# Patient Record
Sex: Female | Born: 1950
Health system: Southern US, Community
[De-identification: ages and names within clinical notes are randomized; demographics above are authoritative.]

## PROBLEM LIST (undated history)

## (undated) DIAGNOSIS — K589 Irritable bowel syndrome without diarrhea: Secondary | ICD-10-CM

## (undated) DIAGNOSIS — M1990 Unspecified osteoarthritis, unspecified site: Secondary | ICD-10-CM

## (undated) DIAGNOSIS — B029 Zoster without complications: Secondary | ICD-10-CM

## (undated) DIAGNOSIS — T7840XA Allergy, unspecified, initial encounter: Secondary | ICD-10-CM

## (undated) DIAGNOSIS — M810 Age-related osteoporosis without current pathological fracture: Secondary | ICD-10-CM

## (undated) DIAGNOSIS — E559 Vitamin D deficiency, unspecified: Secondary | ICD-10-CM

## (undated) DIAGNOSIS — R42 Dizziness and giddiness: Secondary | ICD-10-CM

## (undated) DIAGNOSIS — K5790 Diverticulosis of intestine, part unspecified, without perforation or abscess without bleeding: Secondary | ICD-10-CM

## (undated) DIAGNOSIS — E785 Hyperlipidemia, unspecified: Secondary | ICD-10-CM

## (undated) HISTORY — PX: FOOT SURGERY: SHX648

## (undated) HISTORY — DX: Hyperlipidemia, unspecified: E78.5

## (undated) HISTORY — DX: Zoster without complications: B02.9

## (undated) HISTORY — PX: COSMETIC SURGERY: SHX468

## (undated) HISTORY — DX: Age-related osteoporosis without current pathological fracture: M81.0

## (undated) HISTORY — DX: Allergy, unspecified, initial encounter: T78.40XA

## (undated) HISTORY — PX: COLONOSCOPY: SHX174

## (undated) HISTORY — DX: Unspecified osteoarthritis, unspecified site: M19.90

## (undated) HISTORY — PX: RHINOPLASTY: SUR1284

## (undated) HISTORY — DX: Dizziness and giddiness: R42

## (undated) HISTORY — DX: Diverticulosis of intestine, part unspecified, without perforation or abscess without bleeding: K57.90

## (undated) HISTORY — PX: VEIN SURGERY: SHX48

## (undated) HISTORY — DX: Vitamin D deficiency, unspecified: E55.9

## (undated) HISTORY — DX: Irritable bowel syndrome, unspecified: K58.9

---

## 1999-01-01 ENCOUNTER — Other Ambulatory Visit: Admission: RE | Admit: 1999-01-01 | Discharge: 1999-01-01 | Payer: Self-pay | Admitting: Obstetrics and Gynecology

## 1999-10-03 ENCOUNTER — Other Ambulatory Visit: Admission: RE | Admit: 1999-10-03 | Discharge: 1999-10-03 | Payer: Self-pay | Admitting: Otolaryngology

## 2000-04-03 ENCOUNTER — Other Ambulatory Visit: Admission: RE | Admit: 2000-04-03 | Discharge: 2000-04-03 | Payer: Self-pay | Admitting: Obstetrics and Gynecology

## 2001-04-27 ENCOUNTER — Other Ambulatory Visit: Admission: RE | Admit: 2001-04-27 | Discharge: 2001-04-27 | Payer: Self-pay | Admitting: Obstetrics and Gynecology

## 2002-04-27 ENCOUNTER — Other Ambulatory Visit: Admission: RE | Admit: 2002-04-27 | Discharge: 2002-04-27 | Payer: Self-pay | Admitting: Obstetrics and Gynecology

## 2003-05-08 ENCOUNTER — Other Ambulatory Visit: Admission: RE | Admit: 2003-05-08 | Discharge: 2003-05-08 | Payer: Self-pay | Admitting: Obstetrics and Gynecology

## 2004-05-09 ENCOUNTER — Other Ambulatory Visit: Admission: RE | Admit: 2004-05-09 | Discharge: 2004-05-09 | Payer: Self-pay | Admitting: Obstetrics and Gynecology

## 2005-06-17 ENCOUNTER — Other Ambulatory Visit: Admission: RE | Admit: 2005-06-17 | Discharge: 2005-06-17 | Payer: Self-pay | Admitting: Obstetrics and Gynecology

## 2005-07-08 ENCOUNTER — Emergency Department (HOSPITAL_COMMUNITY): Admission: EM | Admit: 2005-07-08 | Discharge: 2005-07-08 | Payer: Self-pay | Admitting: Emergency Medicine

## 2006-06-30 ENCOUNTER — Ambulatory Visit (HOSPITAL_COMMUNITY): Admission: RE | Admit: 2006-06-30 | Discharge: 2006-06-30 | Payer: Self-pay | Admitting: Obstetrics and Gynecology

## 2011-01-16 ENCOUNTER — Ambulatory Visit (HOSPITAL_COMMUNITY)
Admission: RE | Admit: 2011-01-16 | Discharge: 2011-01-16 | Disposition: A | Discharge status: Home / Self Care | Payer: BC Managed Care – PPO | Source: Ambulatory Visit | Attending: Gastroenterology | Admitting: Gastroenterology

## 2011-01-16 DIAGNOSIS — Z79899 Other long term (current) drug therapy: Secondary | ICD-10-CM | POA: Insufficient documentation

## 2011-01-16 DIAGNOSIS — K5730 Diverticulosis of large intestine without perforation or abscess without bleeding: Secondary | ICD-10-CM | POA: Insufficient documentation

## 2011-01-16 DIAGNOSIS — E785 Hyperlipidemia, unspecified: Secondary | ICD-10-CM | POA: Insufficient documentation

## 2011-01-16 DIAGNOSIS — Z1211 Encounter for screening for malignant neoplasm of colon: Secondary | ICD-10-CM | POA: Insufficient documentation

## 2011-01-16 DIAGNOSIS — R42 Dizziness and giddiness: Secondary | ICD-10-CM | POA: Insufficient documentation

## 2011-01-23 NOTE — Op Note (Signed)
  NAME:  MEELA, WAREING NO.:  [id number]  MEDICAL RECORD NO.:  [id number]  LOCATION:  [facility]                         FACILITY:  [facility]  PHYSICIAN:  Danise Edge, M.D.   DATE OF BIRTH:  May 06, 1951  DATE OF PROCEDURE:  01/16/2011 DATE OF DISCHARGE:                              OPERATIVE REPORT   REFERRING PHYSICIANS:  Pearla Dubonnet, M.D., Maris Berger. Pennie Rushing, M.D.  PROCEDURE:  Screening colonoscopy.  HISTORY:  Ms. Lydia Toren is a 60 year old female, born on [birth year].  The patient underwent a normal screening colonoscopy in 2002. She is scheduled to undergo a repeat screening colonoscopy today.  ENDOSCOPIST:  Danise Edge, M.D.  PREMEDICATION:  Fentanyl 100 mcg, Versed 10 mg.  PROCEDURE:  Anal inspection and digital rectal exam were normal.  The Pentax pediatric colonoscope was introduced into the rectum and easily advanced to the cecum.  A normal-appearing ileocecal valve and appendiceal orifice were identified.  Colonic preparation for the exam today was good.  Rectum normal.  Retroflexed view of the distal rectum normal.  Sigmoid colon and descending colon.  Sigmoid colonic diverticulosis without diverticulitis.  Splenic flexure normal.  Transverse colon normal.  Hepatic flexure normal.  Ascending colon normal.  Cecum and ileocecal valve normal.  ASSESSMENT:  Normal screening proctocolonoscopy to the cecum except for the presence of sigmoid colonic diverticulosis.  RECOMMENDATIONS:  Repeat screening colonoscopy in 10 years.          ______________________________ Danise Edge, M.D.     MJ/MEDQ  D:  01/16/2011  T:  01/17/2011  Job:  [id number]  cc:   Pearla Dubonnet, M.D. Fax: [phone]  Hal Morales, M.D. Fax: [phone]  Electronically Signed by Danise Edge M.D. on 01/23/2011 04:47:56 PM

## 2011-08-27 ENCOUNTER — Ambulatory Visit: Payer: Self-pay | Admitting: Obstetrics and Gynecology

## 2011-09-09 ENCOUNTER — Ambulatory Visit: Payer: Self-pay | Admitting: Obstetrics and Gynecology

## 2011-09-10 ENCOUNTER — Other Ambulatory Visit: Payer: Self-pay

## 2011-09-15 ENCOUNTER — Other Ambulatory Visit (INDEPENDENT_AMBULATORY_CARE_PROVIDER_SITE_OTHER): Payer: BC Managed Care – PPO

## 2011-09-15 ENCOUNTER — Ambulatory Visit: Payer: Self-pay | Admitting: Obstetrics and Gynecology

## 2011-09-15 ENCOUNTER — Encounter: Payer: Self-pay | Admitting: Obstetrics and Gynecology

## 2011-09-15 ENCOUNTER — Ambulatory Visit (INDEPENDENT_AMBULATORY_CARE_PROVIDER_SITE_OTHER): Payer: BC Managed Care – PPO | Admitting: Obstetrics and Gynecology

## 2011-09-15 ENCOUNTER — Other Ambulatory Visit: Payer: Self-pay | Admitting: Obstetrics and Gynecology

## 2011-09-15 VITALS — BP 120/76 | Ht 62.75 in | Wt 155.0 lb

## 2011-09-15 DIAGNOSIS — M899 Disorder of bone, unspecified: Secondary | ICD-10-CM

## 2011-09-15 DIAGNOSIS — Z13820 Encounter for screening for osteoporosis: Secondary | ICD-10-CM

## 2011-09-15 DIAGNOSIS — N951 Menopausal and female climacteric states: Secondary | ICD-10-CM

## 2011-09-15 DIAGNOSIS — M8580 Other specified disorders of bone density and structure, unspecified site: Secondary | ICD-10-CM | POA: Insufficient documentation

## 2011-09-15 DIAGNOSIS — Z01419 Encounter for gynecological examination (general) (routine) without abnormal findings: Secondary | ICD-10-CM

## 2011-09-15 MED ORDER — ZOLPIDEM TARTRATE 10 MG PO TABS: 10.0000 mg | ORAL_TABLET | Freq: Every evening | ORAL | Status: DC | PRN

## 2011-09-15 MED ORDER — ESTRADIOL 2 MG VA RING: 2.0000 mg | VAGINAL_RING | VAGINAL | Status: DC

## 2011-09-15 NOTE — Patient Instructions (Signed)
Calcium Intake Recommendations Age group / Amount of calcium to consume daily, in milligrams (mg)  0 to 6 months / 210 mg   7 to 12 months / 270 mg   1 to 3 years / 500 mg   4 to 8 years / 800 mg   9 to 18 years / 1,300 mg   19 to 50 years / 1,000 mg   51 to 70+ years / 1,200 mg   Pregnant and nursing, under 19 years / 1,300 mg   Pregnant and nursing, over 19 years / 1,000 mg  Document Released: 01/01/2004 Document Revised: 05/08/2011 Document Reviewed: 05/19/2005 ExitCare Patient Information 2012 [employer]. 

## 2011-09-15 NOTE — Progress Notes (Signed)
Subjective:    Sharon Fox is a 61 y.o. female  who presents for annual exam.  The patient has no complaints today.   The following portions of the patient's history were reviewed and updated as appropriate: allergies, current medications, past family history, past medical history, past social history, past surgical history and problem list.  Review of Systems Pertinent items are noted in HPI. Gastrointestinal:No change in bowel habits, no abdominal pain, no rectal bleeding Genitourinary:negative for dysuria, frequency, hematuria, nocturia and urinary incontinence    Objective:     BP 120/76  Ht 5' 2.75" (1.594 m)  Wt 155 lb (70.308 kg)  BMI 27.68 kg/m2  Weight:  Wt Readings from Last 1 Encounters:  09/15/11 155 lb (70.308 kg)     BMI: Body mass index is 27.68 kg/(m^2). General Appearance: Alert, appropriate appearance for age. No acute distress HEENT: Grossly normal Neck / Thyroid: Supple, no masses, nodes or enlargement Lungs: clear to auscultation bilaterally Back: No CVA tenderness Breast Exam: No masses or nodes.No dimpling, nipple retraction or discharge. Cardiovascular: Regular rate and rhythm. S1, S2, no murmur Gastrointestinal: Soft, non-tender, no masses or organomegaly Pelvic Exam: Vulva and vagina appear normal. Bimanual exam reveals normal uterus and adnexa. Rectovaginal: normal rectal, no masses Lymphatic Exam: Non-palpable nodes in neck, clavicular, axillary, or inguinal regions Skin: no rash or abnormalities Neurologic: Normal gait and speech, no tremor  Psychiatric: Alert and oriented, appropriate affect.    Urinalysis:na and Not done DXA:  Osteopenia at all sites with FRAX of 11% for all and 1.6% for hip fracture      Assessment:    Normal gyn exam Hormone replacement therapy Menopause osteopenia    Plan:    All questions answered. Discussed healthy lifestyle modifications. Continue current meds  Vit D level today Increase  exercise Repeat DXA in 2 years.  Follow-up:  for annual exam

## 2011-09-18 LAB — VITAMIN D 1,25 DIHYDROXY
Vitamin D 1, 25 (OH)2 Total: 69 pg/mL (ref 18–72)
Vitamin D2 1, 25 (OH)2: 21 pg/mL
Vitamin D3 1, 25 (OH)2: 48 pg/mL

## 2011-09-29 ENCOUNTER — Ambulatory Visit: Payer: Self-pay | Admitting: Obstetrics and Gynecology

## 2014-04-03 ENCOUNTER — Encounter: Payer: Self-pay | Admitting: Obstetrics and Gynecology

## 2015-07-24 DIAGNOSIS — L723 Sebaceous cyst: Secondary | ICD-10-CM | POA: Diagnosis not present

## 2015-07-24 DIAGNOSIS — Z411 Encounter for cosmetic surgery: Secondary | ICD-10-CM | POA: Diagnosis not present

## 2015-07-24 DIAGNOSIS — L821 Other seborrheic keratosis: Secondary | ICD-10-CM | POA: Diagnosis not present

## 2015-07-24 DIAGNOSIS — L738 Other specified follicular disorders: Secondary | ICD-10-CM | POA: Diagnosis not present

## 2015-07-24 DIAGNOSIS — Z23 Encounter for immunization: Secondary | ICD-10-CM | POA: Diagnosis not present

## 2015-10-12 DIAGNOSIS — Z803 Family history of malignant neoplasm of breast: Secondary | ICD-10-CM | POA: Diagnosis not present

## 2015-10-12 DIAGNOSIS — Z1231 Encounter for screening mammogram for malignant neoplasm of breast: Secondary | ICD-10-CM | POA: Diagnosis not present

## 2015-11-02 ENCOUNTER — Ambulatory Visit (HOSPITAL_COMMUNITY)
Admission: EM | Admit: 2015-11-02 | Discharge: 2015-11-02 | Disposition: A | Discharge status: Home / Self Care | Payer: Medicare Other | Attending: Emergency Medicine | Admitting: Emergency Medicine

## 2015-11-02 ENCOUNTER — Encounter (HOSPITAL_COMMUNITY): Payer: Self-pay

## 2015-11-02 DIAGNOSIS — L720 Epidermal cyst: Secondary | ICD-10-CM | POA: Diagnosis not present

## 2015-11-02 DIAGNOSIS — D225 Melanocytic nevi of trunk: Secondary | ICD-10-CM | POA: Diagnosis not present

## 2015-11-02 DIAGNOSIS — D239 Other benign neoplasm of skin, unspecified: Secondary | ICD-10-CM | POA: Diagnosis not present

## 2015-11-02 DIAGNOSIS — R0789 Other chest pain: Secondary | ICD-10-CM

## 2015-11-02 DIAGNOSIS — L821 Other seborrheic keratosis: Secondary | ICD-10-CM | POA: Diagnosis not present

## 2015-11-02 MED ORDER — NAPROXEN 500 MG PO TABS: 500.0000 mg | ORAL_TABLET | Freq: Two times a day (BID) | ORAL | Status: DC | PRN

## 2015-11-02 MED ORDER — TRAMADOL HCL 50 MG PO TABS: 50.0000 mg | ORAL_TABLET | Freq: Four times a day (QID) | ORAL | Status: DC | PRN
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
## 2015-11-05 DIAGNOSIS — S20219A Contusion of unspecified front wall of thorax, initial encounter: Secondary | ICD-10-CM | POA: Diagnosis not present

## 2016-01-01 DIAGNOSIS — H11153 Pinguecula, bilateral: Secondary | ICD-10-CM | POA: Diagnosis not present

## 2016-01-01 DIAGNOSIS — H01029 Squamous blepharitis unspecified eye, unspecified eyelid: Secondary | ICD-10-CM | POA: Diagnosis not present

## 2016-01-01 DIAGNOSIS — H04123 Dry eye syndrome of bilateral lacrimal glands: Secondary | ICD-10-CM | POA: Diagnosis not present

## 2016-01-29 DIAGNOSIS — Z6826 Body mass index (BMI) 26.0-26.9, adult: Secondary | ICD-10-CM | POA: Diagnosis not present

## 2016-01-29 DIAGNOSIS — G4700 Insomnia, unspecified: Secondary | ICD-10-CM | POA: Diagnosis not present

## 2016-01-29 DIAGNOSIS — N952 Postmenopausal atrophic vaginitis: Secondary | ICD-10-CM | POA: Diagnosis not present

## 2016-01-29 DIAGNOSIS — D259 Leiomyoma of uterus, unspecified: Secondary | ICD-10-CM | POA: Diagnosis not present

## 2016-01-29 DIAGNOSIS — Z01419 Encounter for gynecological examination (general) (routine) without abnormal findings: Secondary | ICD-10-CM | POA: Diagnosis not present

## 2016-02-14 DIAGNOSIS — M8580 Other specified disorders of bone density and structure, unspecified site: Secondary | ICD-10-CM | POA: Diagnosis not present

## 2016-03-11 DIAGNOSIS — Z23 Encounter for immunization: Secondary | ICD-10-CM | POA: Diagnosis not present

## 2016-06-24 DIAGNOSIS — E559 Vitamin D deficiency, unspecified: Secondary | ICD-10-CM | POA: Diagnosis not present

## 2016-06-24 DIAGNOSIS — Z79899 Other long term (current) drug therapy: Secondary | ICD-10-CM | POA: Diagnosis not present

## 2016-06-24 DIAGNOSIS — K5790 Diverticulosis of intestine, part unspecified, without perforation or abscess without bleeding: Secondary | ICD-10-CM | POA: Diagnosis not present

## 2016-06-24 DIAGNOSIS — K589 Irritable bowel syndrome without diarrhea: Secondary | ICD-10-CM | POA: Diagnosis not present

## 2016-06-24 DIAGNOSIS — J301 Allergic rhinitis due to pollen: Secondary | ICD-10-CM | POA: Diagnosis not present

## 2016-06-24 DIAGNOSIS — Z23 Encounter for immunization: Secondary | ICD-10-CM | POA: Diagnosis not present

## 2016-06-24 DIAGNOSIS — Z0000 Encounter for general adult medical examination without abnormal findings: Secondary | ICD-10-CM | POA: Diagnosis not present

## 2016-06-24 DIAGNOSIS — E782 Mixed hyperlipidemia: Secondary | ICD-10-CM | POA: Diagnosis not present

## 2016-06-24 DIAGNOSIS — G4700 Insomnia, unspecified: Secondary | ICD-10-CM | POA: Diagnosis not present

## 2016-08-15 DIAGNOSIS — S92421A Displaced fracture of distal phalanx of right great toe, initial encounter for closed fracture: Secondary | ICD-10-CM | POA: Diagnosis not present

## 2016-09-26 ENCOUNTER — Other Ambulatory Visit: Payer: Self-pay | Admitting: Orthopedic Surgery

## 2016-09-26 DIAGNOSIS — S92421D Displaced fracture of distal phalanx of right great toe, subsequent encounter for fracture with routine healing: Secondary | ICD-10-CM | POA: Diagnosis not present

## 2016-09-26 DIAGNOSIS — S92421A Displaced fracture of distal phalanx of right great toe, initial encounter for closed fracture: Secondary | ICD-10-CM

## 2016-09-26 DIAGNOSIS — M79671 Pain in right foot: Secondary | ICD-10-CM | POA: Diagnosis not present

## 2016-09-26 DIAGNOSIS — G8929 Other chronic pain: Secondary | ICD-10-CM | POA: Diagnosis not present

## 2016-10-06 ENCOUNTER — Ambulatory Visit
Admission: RE | Admit: 2016-10-06 | Discharge: 2016-10-06 | Disposition: A | Discharge status: Home / Self Care | Payer: Medicare Other | Source: Ambulatory Visit | Attending: Orthopedic Surgery | Admitting: Orthopedic Surgery

## 2016-10-06 DIAGNOSIS — S92421K Displaced fracture of distal phalanx of right great toe, subsequent encounter for fracture with nonunion: Secondary | ICD-10-CM | POA: Diagnosis not present

## 2016-10-06 DIAGNOSIS — S92421A Displaced fracture of distal phalanx of right great toe, initial encounter for closed fracture: Secondary | ICD-10-CM

## 2016-10-15 DIAGNOSIS — S92411K Displaced fracture of proximal phalanx of right great toe, subsequent encounter for fracture with nonunion: Secondary | ICD-10-CM | POA: Diagnosis not present

## 2016-10-15 DIAGNOSIS — M19071 Primary osteoarthritis, right ankle and foot: Secondary | ICD-10-CM | POA: Diagnosis not present

## 2016-11-14 DIAGNOSIS — D225 Melanocytic nevi of trunk: Secondary | ICD-10-CM | POA: Diagnosis not present

## 2016-11-14 DIAGNOSIS — Z1231 Encounter for screening mammogram for malignant neoplasm of breast: Secondary | ICD-10-CM | POA: Diagnosis not present

## 2016-11-14 DIAGNOSIS — D235 Other benign neoplasm of skin of trunk: Secondary | ICD-10-CM | POA: Diagnosis not present

## 2016-11-14 DIAGNOSIS — D2230 Melanocytic nevi of unspecified part of face: Secondary | ICD-10-CM | POA: Diagnosis not present

## 2016-11-14 DIAGNOSIS — L720 Epidermal cyst: Secondary | ICD-10-CM | POA: Diagnosis not present

## 2016-11-14 DIAGNOSIS — L821 Other seborrheic keratosis: Secondary | ICD-10-CM | POA: Diagnosis not present

## 2016-12-24 DIAGNOSIS — M79672 Pain in left foot: Secondary | ICD-10-CM | POA: Diagnosis not present

## 2017-01-14 DIAGNOSIS — M545 Low back pain: Secondary | ICD-10-CM | POA: Diagnosis not present

## 2017-01-14 DIAGNOSIS — R197 Diarrhea, unspecified: Secondary | ICD-10-CM | POA: Diagnosis not present

## 2017-01-14 DIAGNOSIS — D259 Leiomyoma of uterus, unspecified: Secondary | ICD-10-CM | POA: Diagnosis not present

## 2017-01-14 DIAGNOSIS — R102 Pelvic and perineal pain: Secondary | ICD-10-CM | POA: Diagnosis not present

## 2017-01-21 DIAGNOSIS — M79672 Pain in left foot: Secondary | ICD-10-CM | POA: Diagnosis not present

## 2017-01-21 DIAGNOSIS — R2242 Localized swelling, mass and lump, left lower limb: Secondary | ICD-10-CM | POA: Diagnosis not present

## 2017-01-23 DIAGNOSIS — M79672 Pain in left foot: Secondary | ICD-10-CM | POA: Diagnosis not present

## 2017-01-27 DIAGNOSIS — D259 Leiomyoma of uterus, unspecified: Secondary | ICD-10-CM | POA: Diagnosis not present

## 2017-01-27 DIAGNOSIS — K580 Irritable bowel syndrome with diarrhea: Secondary | ICD-10-CM | POA: Diagnosis not present

## 2017-01-27 DIAGNOSIS — R102 Pelvic and perineal pain: Secondary | ICD-10-CM | POA: Diagnosis not present

## 2017-01-27 DIAGNOSIS — R1032 Left lower quadrant pain: Secondary | ICD-10-CM | POA: Diagnosis not present

## 2017-01-30 DIAGNOSIS — M79672 Pain in left foot: Secondary | ICD-10-CM | POA: Diagnosis not present

## 2017-01-30 DIAGNOSIS — R2242 Localized swelling, mass and lump, left lower limb: Secondary | ICD-10-CM | POA: Diagnosis not present

## 2017-01-30 DIAGNOSIS — D2122 Benign neoplasm of connective and other soft tissue of left lower limb, including hip: Secondary | ICD-10-CM | POA: Diagnosis not present

## 2017-02-03 DIAGNOSIS — Z23 Encounter for immunization: Secondary | ICD-10-CM | POA: Diagnosis not present

## 2017-02-03 DIAGNOSIS — L723 Sebaceous cyst: Secondary | ICD-10-CM | POA: Diagnosis not present

## 2017-02-03 DIAGNOSIS — L309 Dermatitis, unspecified: Secondary | ICD-10-CM | POA: Diagnosis not present

## 2017-02-16 DIAGNOSIS — K580 Irritable bowel syndrome with diarrhea: Secondary | ICD-10-CM | POA: Diagnosis not present

## 2017-02-19 DIAGNOSIS — L723 Sebaceous cyst: Secondary | ICD-10-CM | POA: Diagnosis not present

## 2017-02-19 DIAGNOSIS — Z23 Encounter for immunization: Secondary | ICD-10-CM | POA: Diagnosis not present

## 2017-03-21 DIAGNOSIS — Z23 Encounter for immunization: Secondary | ICD-10-CM | POA: Diagnosis not present

## 2017-05-08 DIAGNOSIS — T1512XA Foreign body in conjunctival sac, left eye, initial encounter: Secondary | ICD-10-CM | POA: Diagnosis not present

## 2017-05-08 DIAGNOSIS — H10413 Chronic giant papillary conjunctivitis, bilateral: Secondary | ICD-10-CM | POA: Diagnosis not present

## 2017-05-08 DIAGNOSIS — H04123 Dry eye syndrome of bilateral lacrimal glands: Secondary | ICD-10-CM | POA: Diagnosis not present

## 2017-05-15 DIAGNOSIS — T1512XA Foreign body in conjunctival sac, left eye, initial encounter: Secondary | ICD-10-CM | POA: Diagnosis not present

## 2017-05-15 DIAGNOSIS — H04123 Dry eye syndrome of bilateral lacrimal glands: Secondary | ICD-10-CM | POA: Diagnosis not present

## 2017-05-15 DIAGNOSIS — H10413 Chronic giant papillary conjunctivitis, bilateral: Secondary | ICD-10-CM | POA: Diagnosis not present

## 2017-06-04 DIAGNOSIS — L820 Inflamed seborrheic keratosis: Secondary | ICD-10-CM | POA: Diagnosis not present

## 2017-06-04 DIAGNOSIS — Z23 Encounter for immunization: Secondary | ICD-10-CM | POA: Diagnosis not present

## 2017-06-25 DIAGNOSIS — H2513 Age-related nuclear cataract, bilateral: Secondary | ICD-10-CM | POA: Diagnosis not present

## 2017-06-25 DIAGNOSIS — H04123 Dry eye syndrome of bilateral lacrimal glands: Secondary | ICD-10-CM | POA: Diagnosis not present

## 2017-06-25 DIAGNOSIS — H353131 Nonexudative age-related macular degeneration, bilateral, early dry stage: Secondary | ICD-10-CM | POA: Diagnosis not present

## 2017-06-30 DIAGNOSIS — K589 Irritable bowel syndrome without diarrhea: Secondary | ICD-10-CM | POA: Diagnosis not present

## 2017-06-30 DIAGNOSIS — K5790 Diverticulosis of intestine, part unspecified, without perforation or abscess without bleeding: Secondary | ICD-10-CM | POA: Diagnosis not present

## 2017-06-30 DIAGNOSIS — Z23 Encounter for immunization: Secondary | ICD-10-CM | POA: Diagnosis not present

## 2017-06-30 DIAGNOSIS — H6120 Impacted cerumen, unspecified ear: Secondary | ICD-10-CM | POA: Diagnosis not present

## 2017-06-30 DIAGNOSIS — R1032 Left lower quadrant pain: Secondary | ICD-10-CM | POA: Diagnosis not present

## 2017-06-30 DIAGNOSIS — G4700 Insomnia, unspecified: Secondary | ICD-10-CM | POA: Diagnosis not present

## 2017-06-30 DIAGNOSIS — Z0000 Encounter for general adult medical examination without abnormal findings: Secondary | ICD-10-CM | POA: Diagnosis not present

## 2017-06-30 DIAGNOSIS — Z1389 Encounter for screening for other disorder: Secondary | ICD-10-CM | POA: Diagnosis not present

## 2017-06-30 DIAGNOSIS — E782 Mixed hyperlipidemia: Secondary | ICD-10-CM | POA: Diagnosis not present

## 2017-06-30 DIAGNOSIS — Z79899 Other long term (current) drug therapy: Secondary | ICD-10-CM | POA: Diagnosis not present

## 2017-06-30 DIAGNOSIS — E559 Vitamin D deficiency, unspecified: Secondary | ICD-10-CM | POA: Diagnosis not present

## 2017-09-02 DIAGNOSIS — K589 Irritable bowel syndrome without diarrhea: Secondary | ICD-10-CM | POA: Diagnosis not present

## 2017-09-03 ENCOUNTER — Encounter: Payer: Self-pay | Admitting: Internal Medicine

## 2017-10-19 ENCOUNTER — Encounter: Payer: Self-pay | Admitting: Internal Medicine

## 2017-10-19 ENCOUNTER — Encounter (INDEPENDENT_AMBULATORY_CARE_PROVIDER_SITE_OTHER): Payer: Self-pay

## 2017-10-19 ENCOUNTER — Ambulatory Visit (INDEPENDENT_AMBULATORY_CARE_PROVIDER_SITE_OTHER): Payer: Medicare Other | Admitting: Internal Medicine

## 2017-10-19 ENCOUNTER — Encounter

## 2017-10-19 ENCOUNTER — Other Ambulatory Visit (INDEPENDENT_AMBULATORY_CARE_PROVIDER_SITE_OTHER): Payer: Medicare Other

## 2017-10-19 VITALS — BP 118/76 | HR 70 | Ht 63.0 in | Wt 150.5 lb

## 2017-10-19 DIAGNOSIS — K589 Irritable bowel syndrome without diarrhea: Secondary | ICD-10-CM | POA: Diagnosis not present

## 2017-10-19 DIAGNOSIS — R197 Diarrhea, unspecified: Secondary | ICD-10-CM | POA: Diagnosis not present

## 2017-10-19 LAB — IGA: IgA: 460 mg/dL — ABNORMAL HIGH (ref 68–378)

## 2017-10-19 NOTE — Patient Instructions (Signed)
Your provider has requested that you go to the basement level for lab work before leaving today. Press "B" on the elevator. The lab is located at the first door on the left as you exit the elevator.  Continue to take Align  Please follow up in 3 months

## 2017-10-19 NOTE — Progress Notes (Signed)
HISTORY OF PRESENT ILLNESS:  Sharon Fox is a 67 y.o. 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 Inda Merlin (at patient's request, friends with Si Raider, regarding irritable bowel syndrome. The patient reports a greater than 20 year history of problems with intermittent urgency followed by diarrhea. Typically exacerbated by stress. Underwent a colonoscopy with Dr. Timmothy Euler at age 66 and with Dr. Wynetta Emery at age 89. Negative except for diverticulosis. No biopsies obtained. She is also been evaluated by Dr. Clarene Essex (no records). Patient tells me that she will have problems once every 4-6 weeks. May last anywhere from one day to one week. Last such issues were approximately one month ago. Stress is a reliable predictor. No obvious dietary precipitants though she does feel better with gluten avoidance. No nocturnal symptoms. If she takes a half of an Imodium when needed, this helps. She does have less than sublingual which she use just once. This seemed to help. She had been on probiotic align, but not recently. No family history of inflammatory bowel disease or colon cancer. She denies having been tested for celiac disease. No weight loss or bleeding. No constipation. No new medications.  REVIEW OF SYSTEMS:  All non-GI ROS negative unless otherwise stated in the history of present illness except for stress, anxiety, arthritis  Past Medical History:  Diagnosis Date  . Allergy    cipro and seasonal   . Arthritis   . Diverticulosis   . DJD (degenerative joint disease)   . Hyperlipidemia   . IBS (irritable bowel syndrome)   . Osteoarthritis   . Shingles   . Vertigo   . Vitamin D deficiency     Past Surgical History:  Procedure Laterality Date  . CESAREAN SECTION    . COSMETIC SURGERY     at age 74    Social History Sharon Fox  reports that she has never smoked. She has never used smokeless tobacco. She reports that she drinks  alcohol. She reports that she does not use drugs.  family history includes Cancer in her maternal grandmother.  Allergies  Allergen Reactions  . Ciprofloxacin Rash       PHYSICAL EXAMINATION: Vital signs: BP 118/76   Pulse 70   Ht 5\' 3"  (1.6 m)   Wt 150 lb 8 oz (68.3 kg)   BMI 26.66 kg/m   Constitutional: pleasant,generally well-appearing, no acute distress Psychiatric: alert and oriented x3, cooperative Eyes: extraocular movements intact, anicteric, conjunctiva pink Mouth: oral pharynx moist, no lesions Neck: supple without thyromegaly Lymph: no lymphadenopathy Cardiovascular: heart regular rate and rhythm, no murmur Lungs: clear to auscultation bilaterally Abdomen: soft, nontender, nondistended, no obvious ascites, no peritoneal signs, normal bowel sounds, no organomegaly Rectal:omitted Extremities: no clubbing, cyanosis, or lower extremity edema bilaterally Skin: no lesions on visible extremities Neuro: No focal deficits. Cranial nerves intact.No asterixis.   ASSESSMENT:  #1. Diarrhea predominant irritable bowel syndrome. Relatively infrequent but long-standing. Exacerbated by stress. We discussed other diagnoses which may mask arrayed as irritable bowel syndrome such as celiac disease, bile salt related diarrhea, and microscopic colitis. I also discussed their workup and treatment  #2. Incidental diverticulosis   PLAN:  #1. Screen for celiac disease with serologies. We will contact her with the results when available #2. Reinitiate probiotic align one daily for 2-4 weeks. May use on demand if helpful #3. Continue Levsin sublingual as needed for abdominal cramping #4. Continue low-dose Imodium as needed for diarrhea #5. Discussed  the role of colonoscopy with biopsies to rule out microscopic colitis. Probably would wait unless the frequency and severity of her problem accelerated. She agrees #6. Otherwise routine screening colonoscopy at age 49 (around August 2022)   #7. Routine GI follow-up 3 months  A copy of this consultation note has been sent to Dr. Inda Merlin

## 2017-10-20 LAB — TISSUE TRANSGLUTAMINASE, IGA: (TTG) AB, IGA: 1 U/mL

## 2017-10-25 DIAGNOSIS — R05 Cough: Secondary | ICD-10-CM | POA: Diagnosis not present

## 2017-10-25 DIAGNOSIS — J069 Acute upper respiratory infection, unspecified: Secondary | ICD-10-CM | POA: Diagnosis not present

## 2017-10-29 DIAGNOSIS — J209 Acute bronchitis, unspecified: Secondary | ICD-10-CM | POA: Diagnosis not present

## 2017-11-03 DIAGNOSIS — M79672 Pain in left foot: Secondary | ICD-10-CM | POA: Diagnosis not present

## 2017-11-10 DIAGNOSIS — L820 Inflamed seborrheic keratosis: Secondary | ICD-10-CM | POA: Diagnosis not present

## 2017-11-10 DIAGNOSIS — L821 Other seborrheic keratosis: Secondary | ICD-10-CM | POA: Diagnosis not present

## 2017-11-10 DIAGNOSIS — D235 Other benign neoplasm of skin of trunk: Secondary | ICD-10-CM | POA: Diagnosis not present

## 2017-11-10 DIAGNOSIS — D2261 Melanocytic nevi of right upper limb, including shoulder: Secondary | ICD-10-CM | POA: Diagnosis not present

## 2017-11-10 DIAGNOSIS — D1801 Hemangioma of skin and subcutaneous tissue: Secondary | ICD-10-CM | POA: Diagnosis not present

## 2017-11-10 DIAGNOSIS — D225 Melanocytic nevi of trunk: Secondary | ICD-10-CM | POA: Diagnosis not present

## 2017-11-17 DIAGNOSIS — Z1231 Encounter for screening mammogram for malignant neoplasm of breast: Secondary | ICD-10-CM | POA: Diagnosis not present

## 2018-02-03 DIAGNOSIS — N9089 Other specified noninflammatory disorders of vulva and perineum: Secondary | ICD-10-CM | POA: Diagnosis not present

## 2018-02-03 DIAGNOSIS — Z6826 Body mass index (BMI) 26.0-26.9, adult: Secondary | ICD-10-CM | POA: Diagnosis not present

## 2018-02-03 DIAGNOSIS — Z124 Encounter for screening for malignant neoplasm of cervix: Secondary | ICD-10-CM | POA: Diagnosis not present

## 2018-02-03 DIAGNOSIS — R87618 Other abnormal cytological findings on specimens from cervix uteri: Secondary | ICD-10-CM | POA: Diagnosis not present

## 2018-02-03 DIAGNOSIS — N952 Postmenopausal atrophic vaginitis: Secondary | ICD-10-CM | POA: Diagnosis not present

## 2018-02-03 DIAGNOSIS — Z01419 Encounter for gynecological examination (general) (routine) without abnormal findings: Secondary | ICD-10-CM | POA: Diagnosis not present

## 2018-03-07 DIAGNOSIS — Z23 Encounter for immunization: Secondary | ICD-10-CM | POA: Diagnosis not present

## 2018-03-09 DIAGNOSIS — M1612 Unilateral primary osteoarthritis, left hip: Secondary | ICD-10-CM | POA: Diagnosis not present

## 2018-03-09 DIAGNOSIS — M545 Low back pain: Secondary | ICD-10-CM | POA: Diagnosis not present

## 2018-03-11 DIAGNOSIS — M25552 Pain in left hip: Secondary | ICD-10-CM | POA: Diagnosis not present

## 2018-03-11 DIAGNOSIS — S39012D Strain of muscle, fascia and tendon of lower back, subsequent encounter: Secondary | ICD-10-CM | POA: Diagnosis not present

## 2018-03-19 DIAGNOSIS — M25552 Pain in left hip: Secondary | ICD-10-CM | POA: Diagnosis not present

## 2018-03-19 DIAGNOSIS — S39012D Strain of muscle, fascia and tendon of lower back, subsequent encounter: Secondary | ICD-10-CM | POA: Diagnosis not present

## 2018-03-24 DIAGNOSIS — S39012D Strain of muscle, fascia and tendon of lower back, subsequent encounter: Secondary | ICD-10-CM | POA: Diagnosis not present

## 2018-03-24 DIAGNOSIS — M25552 Pain in left hip: Secondary | ICD-10-CM | POA: Diagnosis not present

## 2018-03-26 DIAGNOSIS — M25552 Pain in left hip: Secondary | ICD-10-CM | POA: Diagnosis not present

## 2018-03-26 DIAGNOSIS — S39012D Strain of muscle, fascia and tendon of lower back, subsequent encounter: Secondary | ICD-10-CM | POA: Diagnosis not present

## 2018-03-31 DIAGNOSIS — S39012D Strain of muscle, fascia and tendon of lower back, subsequent encounter: Secondary | ICD-10-CM | POA: Diagnosis not present

## 2018-03-31 DIAGNOSIS — M25552 Pain in left hip: Secondary | ICD-10-CM | POA: Diagnosis not present

## 2018-04-02 DIAGNOSIS — S39012D Strain of muscle, fascia and tendon of lower back, subsequent encounter: Secondary | ICD-10-CM | POA: Diagnosis not present

## 2018-04-02 DIAGNOSIS — M25552 Pain in left hip: Secondary | ICD-10-CM | POA: Diagnosis not present

## 2018-04-06 DIAGNOSIS — S39012D Strain of muscle, fascia and tendon of lower back, subsequent encounter: Secondary | ICD-10-CM | POA: Diagnosis not present

## 2018-04-06 DIAGNOSIS — M25552 Pain in left hip: Secondary | ICD-10-CM | POA: Diagnosis not present

## 2018-04-14 DIAGNOSIS — S39012D Strain of muscle, fascia and tendon of lower back, subsequent encounter: Secondary | ICD-10-CM | POA: Diagnosis not present

## 2018-04-14 DIAGNOSIS — M25552 Pain in left hip: Secondary | ICD-10-CM | POA: Diagnosis not present

## 2018-04-15 DIAGNOSIS — M1712 Unilateral primary osteoarthritis, left knee: Secondary | ICD-10-CM | POA: Diagnosis not present

## 2018-04-22 DIAGNOSIS — M545 Low back pain: Secondary | ICD-10-CM | POA: Diagnosis not present

## 2018-06-25 DIAGNOSIS — H2513 Age-related nuclear cataract, bilateral: Secondary | ICD-10-CM | POA: Diagnosis not present

## 2018-06-25 DIAGNOSIS — H04123 Dry eye syndrome of bilateral lacrimal glands: Secondary | ICD-10-CM | POA: Diagnosis not present

## 2018-06-25 DIAGNOSIS — H353131 Nonexudative age-related macular degeneration, bilateral, early dry stage: Secondary | ICD-10-CM | POA: Diagnosis not present

## 2018-07-06 DIAGNOSIS — K589 Irritable bowel syndrome without diarrhea: Secondary | ICD-10-CM | POA: Diagnosis not present

## 2018-07-06 DIAGNOSIS — K5790 Diverticulosis of intestine, part unspecified, without perforation or abscess without bleeding: Secondary | ICD-10-CM | POA: Diagnosis not present

## 2018-07-06 DIAGNOSIS — Z79899 Other long term (current) drug therapy: Secondary | ICD-10-CM | POA: Diagnosis not present

## 2018-07-06 DIAGNOSIS — Z23 Encounter for immunization: Secondary | ICD-10-CM | POA: Diagnosis not present

## 2018-07-06 DIAGNOSIS — E559 Vitamin D deficiency, unspecified: Secondary | ICD-10-CM | POA: Diagnosis not present

## 2018-07-06 DIAGNOSIS — H6121 Impacted cerumen, right ear: Secondary | ICD-10-CM | POA: Diagnosis not present

## 2018-07-06 DIAGNOSIS — M1990 Unspecified osteoarthritis, unspecified site: Secondary | ICD-10-CM | POA: Diagnosis not present

## 2018-07-06 DIAGNOSIS — J301 Allergic rhinitis due to pollen: Secondary | ICD-10-CM | POA: Diagnosis not present

## 2018-07-06 DIAGNOSIS — G4700 Insomnia, unspecified: Secondary | ICD-10-CM | POA: Diagnosis not present

## 2018-07-06 DIAGNOSIS — Z0000 Encounter for general adult medical examination without abnormal findings: Secondary | ICD-10-CM | POA: Diagnosis not present

## 2018-07-06 DIAGNOSIS — E782 Mixed hyperlipidemia: Secondary | ICD-10-CM | POA: Diagnosis not present

## 2018-07-06 DIAGNOSIS — Z1389 Encounter for screening for other disorder: Secondary | ICD-10-CM | POA: Diagnosis not present

## 2018-08-31 ENCOUNTER — Ambulatory Visit: Payer: Medicare Other | Admitting: Internal Medicine

## 2018-09-06 ENCOUNTER — Other Ambulatory Visit: Payer: Self-pay

## 2018-09-06 ENCOUNTER — Ambulatory Visit (INDEPENDENT_AMBULATORY_CARE_PROVIDER_SITE_OTHER): Payer: Medicare Other | Admitting: Internal Medicine

## 2018-09-06 ENCOUNTER — Telehealth: Payer: Self-pay | Admitting: Internal Medicine

## 2018-09-06 ENCOUNTER — Telehealth: Payer: Medicare Other | Admitting: Family

## 2018-09-06 ENCOUNTER — Other Ambulatory Visit: Payer: Medicare Other

## 2018-09-06 ENCOUNTER — Encounter: Payer: Self-pay | Admitting: Internal Medicine

## 2018-09-06 VITALS — Ht 63.0 in | Wt 150.0 lb

## 2018-09-06 DIAGNOSIS — R05 Cough: Secondary | ICD-10-CM

## 2018-09-06 DIAGNOSIS — R197 Diarrhea, unspecified: Secondary | ICD-10-CM | POA: Diagnosis not present

## 2018-09-06 DIAGNOSIS — K580 Irritable bowel syndrome with diarrhea: Secondary | ICD-10-CM

## 2018-09-06 DIAGNOSIS — R509 Fever, unspecified: Secondary | ICD-10-CM | POA: Diagnosis not present

## 2018-09-06 DIAGNOSIS — R059 Cough, unspecified: Secondary | ICD-10-CM

## 2018-09-06 DIAGNOSIS — R058 Other specified cough: Secondary | ICD-10-CM

## 2018-09-06 MED ORDER — BENZONATATE 100 MG PO CAPS: 100.0000 mg | ORAL_CAPSULE | Freq: Two times a day (BID) | ORAL | 0 refills | Status: DC | PRN

## 2018-09-06 NOTE — Telephone Encounter (Signed)
Patient info. Given to [place]. She is scheduling phone visit with Dr. Carlean Purl and calling patient

## 2018-09-06 NOTE — Progress Notes (Signed)
E-Visit for Corona Virus Screening  Based on your current symptoms, you may very well have the virus, however your symptoms are mild. Currently, not all patients are being tested. If the symptoms are mild and there is not a known exposure, performing the test is not indicated.  Coronavirus disease 2019 (COVID-19) is a respiratory illness that can spread from person to person. The virus that causes COVID-19 is a new virus that was first identified in the country of [place] but is now found in multiple other countries and has spread to the [place].  Symptoms associated with the virus are mild to severe fever, cough, and shortness of breath. There is currently no vaccine to protect against COVID-19, and there is no specific antiviral treatment for the virus.   To be considered HIGH RISK for Coronavirus (COVID-19), you have to meet the following criteria:  . Traveled to [place], [place], [place], [place] or [place]; or in the [place] to [place], [place], [place], or [place]; and have fever, cough, and shortness of breath within the last 2 weeks of travel OR  . Been in close contact with a person diagnosed with COVID-19 within the last 2 weeks and have fever, cough, and shortness of breath  . IF YOU DO NOT MEET THESE CRITERIA, YOU ARE CONSIDERED LOW RISK FOR COVID-19.   It is vitally important that if you feel that you have an infection such as this virus or any other virus that you stay home and away from places where you may spread it to others.  You should self-quarantine for 14 days if you have symptoms that could potentially be coronavirus and avoid contact with people age 56 and older.   Testing currently is only being done through the in-patient setting for critically ill patients. We do not have the ability to order testing through e-visit.   You can use medication such as A prescription cough medication called Tessalon Perles 100 mg. You may take 1-2 capsules every 8 hours as  needed for cough  You may also take acetaminophen (Tylenol) as needed for fever.   Reduce your risk of any infection by using the same precautions used for avoiding the common cold or flu:  Marland Kitchen Wash your hands often with soap and warm water for at least 20 seconds.  If soap and water are not readily available, use an alcohol-based hand sanitizer with at least 60% alcohol.  . If coughing or sneezing, cover your mouth and nose by coughing or sneezing into the elbow areas of your shirt or coat, into a tissue or into your sleeve (not your hands). . Avoid shaking hands with others and consider head nods or verbal greetings only. . Avoid touching your eyes, nose, or mouth with unwashed hands.  . Avoid close contact with people who are sick. . Avoid places or events with large numbers of people in one location, like concerts or sporting events. . Carefully consider travel plans you have or are making. . If you are planning any travel outside or inside the [place], visit the CDC's Travelers' Health webpage for the latest health notices. . If you have some symptoms but not all symptoms, continue to monitor at home and seek medical attention if your symptoms worsen. . If you are having a medical emergency, call 911.  HOME CARE . Only take medications as instructed by your medical team. . Drink plenty of fluids and get plenty of rest. . A steam or ultrasonic humidifier  can help if you have congestion.   GET HELP RIGHT AWAY IF: . You develop worsening fever. . You become short of breath . You cough up blood. . Your symptoms become more severe MAKE SURE YOU   Understand these instructions.  Will watch your condition.  Will get help right away if you are not doing well or get worse.  Your e-visit answers were reviewed by a board certified advanced clinical practitioner to complete your personal care plan.  Depending on the condition, your plan could have included both over the counter or prescription  medications.  If there is a problem please reply once you have received a response from your provider. Your safety is important to [place].  If you have drug allergies check your prescription carefully.    You can use MyChart to ask questions about today's visit, request a non-urgent call back, or ask for a work or school excuse for 24 hours related to this e-Visit. If it has been greater than 24 hours you will need to follow up with your provider, or enter a new e-Visit to address those concerns. You will get an e-mail in the next two days asking about your experience.  I hope that your e-visit has been valuable and will speed your recovery. Thank you for using e-visits.

## 2018-09-06 NOTE — Patient Instructions (Signed)
As we discussed today please do the following:  1) Husband to come and get stool study containers for you to collect and he can return tomorrow. We will check for gastrointestinal infections that way.  2) Continue social distancing precautions as you have been. Careful and frequent hand-washing. Understand that it may be too late to quarrantine from your husband.  3) Take hyoscyamine 1 before meals and at bedtime  4) Stay on a soft bland diet  5) Hydrate well  We will call results but if something changes let us know and if you develop significant breathing problems contact Dr. Inda Merlin or if very severe go to emergency department.  Wear a mask if you leave the house but you should try to stay at home.  I appreciate the opportunity to care for you. Gatha Mayer, MD, [facility]

## 2018-09-06 NOTE — Progress Notes (Signed)
TELEHEALTH ENCOUNTER IN SETTING OF COVID-19 PANDEMIC - REQUESTED BY PATIENT SERVICE PROVIDED BY TELEMEDECINE PATIENT LOCATION: Home PATIENT HAS CONSENTED TO TELEHEALTH VISIT PROVIDER LOCATION: OFFICE    Sharon Fox 68 y.o. [birth year] [id number]  Assessment & Plan:   Encounter Diagnoses  Name Primary?  . Diarrhea, unspecified type Yes  . Fever, unspecified fever cause T max 100.42F   . Dry cough   . Irritable bowel syndrome with diarrhea    There are a multitude of possible problems here in COVID-19 infection is one of them.  Because of that I do not think she should come into the office.  Based upon the history as best I can tell I do not think she needs any cross-sectional imaging, but I think it makes sense to work-up for infectious causes of diarrhea other than COVID-19.  Her husband will come and pick up stool specimen collection devices and she will collect and submit these through her husband.  She will monitor her temperature, she will continue the type of bland soft diet and try taking the hyoscyamine before meals and at bedtime.  She knows to contact us if things deteriorate or fail to improve and I cautioned her to ask her primary care provider for advice if respiratory symptoms develop i.e. shortness of breath, or if severe to proceed to the emergency department.  She should follow Covid-19 precautions and wear a mask if she leaves the house though she should try to remain at home.  I appreciate the opportunity to care for this patient. CC: Sharon Huddle, MD Dr. Scarlette Shorts  Subjective:   Chief Complaint: abdominal pain, diarrhea, fever  HPI The patient is a 68 year old white woman with a longstanding history of irritable bowel syndrome, diarrhea predominant, seen by Dr. Henrene Pastor once in 2019.  She is now having diarrhea symptoms that are mainly postprandial associated with crampy left lower quadrant pain and a low-grade fever up to 100.4 Fahrenheit for the past  week.  The diarrhea actually has been going on for about 3 weeks.  There is a background history of coming down with some type of a gastroenteritis type syndrome after visiting grandchildren in [place] about 2 months ago.  Nausea vomiting and diarrhea with some persistent diarrhea that took a while.  Over time she reverted to her normal episodic crampy abdominal pain and loose stools consistent with her IBS D.  However about 3 weeks ago she has started having mild left lower quadrant crampy pain and more regular postprandial loose stools.  In the past week or so she has had a temperature up to 100.4 Fahrenheit maximum off and on.  She has used a heating pad at night due to discomfort but does not describe any severe abdominal pain.  She has also noticed a dry cough in the last week.  She was screened with an electronic visit for COVID-19 and was told she may have the virus but would not be tested due to mild symptoms and lack of need for hospitalization.  She does not have any significant sore throat, body aches or respiratory distress at all.  There are no recent antibiotics no foreign travel no sick contacts.  No COVID-19 contacts.  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  So she thinks stress may be involved but fever is  not part of her typical IBS problems, obviously.  She has been on a bland and soft diet.  She has hyoscyamine prescribed by Dr. Inda Merlin last year but has used it sparingly and is not sure exactly how to use this.  She has eliminated coffee and wine.  Using some Pedialyte and trying to stay hydrated.  2 previous negative screening colonoscopies, the last by Dr. Earle Gell in 2012 and recommended to repeat in 2022 by Dr. Henrene Pastor when he saw her in 2019. Allergies  Allergen Reactions  . Ciprofloxacin Rash   Current Meds   Medication Sig  . cholecalciferol (VITAMIN D) 1000 units tablet Take 1,000 Units by mouth daily.  Marland Kitchen estradiol (ESTRING) 2 MG vaginal ring Place 2 mg vaginally every 3 (three) months. follow package directions  . hyoscyamine (ANASPAZ) 0.125 MG TBDP disintergrating tablet Place 0.125 mg under the tongue every 4 (four) hours as needed.  . Multiple Vitamins-Minerals (CENTRUM SILVER PO) Take 1 capsule by mouth daily.  . Probiotic Product (ALIGN) 4 MG CAPS Take 1 capsule by mouth daily.  Marland Kitchen zolpidem (AMBIEN) 10 MG tablet Take 1 tablet (10 mg total) by mouth at bedtime as needed.   Past Medical History:  Diagnosis Date  . Allergy    cipro and seasonal   . Arthritis   . Diverticulosis   . DJD (degenerative joint disease)   . Hyperlipidemia   . IBS (irritable bowel syndrome)   . Osteoarthritis   . Shingles   . Vertigo   . Vitamin D deficiency    Past Surgical History:  Procedure Laterality Date  . CESAREAN SECTION    . COLONOSCOPY     2002, 2012 both negtive (diverticulosis)  . 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   Review of System As per HPI

## 2018-09-06 NOTE — Telephone Encounter (Signed)
Patient called with C/O cramping in LLQ and Diarrhea for 3 weeks. Diarrhea is about 30 mins.  After anything she eats. She took Imodium once a day Friday/Sat./& Sun. Which helped some with the Diarrhea. And has taken the Hyoscyamine 4 times over the past week which has not helped with the cramping. Independent of her GI symptoms, she has had a low grade fever and dry cough for 1 week. Highest fever was last night 100.4 but responded to Tylenol. As DOD please advise.

## 2018-09-06 NOTE — Telephone Encounter (Signed)
We should set her up for a phone visit with me today please.  Coordinate with Estill Bamberg who is my CMA today

## 2018-09-07 ENCOUNTER — Other Ambulatory Visit: Payer: Medicare Other

## 2018-09-07 DIAGNOSIS — R197 Diarrhea, unspecified: Secondary | ICD-10-CM

## 2018-09-08 LAB — GASTROINTESTINAL PATHOGEN PANEL PCR
C. difficile Tox A/B, PCR: NOT DETECTED
Campylobacter, PCR: NOT DETECTED
Cryptosporidium, PCR: NOT DETECTED
E coli (ETEC) LT/ST PCR: NOT DETECTED
E coli (STEC) stx1/stx2, PCR: NOT DETECTED
E coli 0157, PCR: NOT DETECTED
Giardia lamblia, PCR: NOT DETECTED
Norovirus, PCR: NOT DETECTED
Rotavirus A, PCR: NOT DETECTED
Salmonella, PCR: NOT DETECTED
Shigella, PCR: NOT DETECTED

## 2018-09-08 LAB — CLOSTRIDIUM DIFFICILE TOXIN B, QUALITATIVE, REAL-TIME PCR: Toxigenic C. Difficile by PCR: NOT DETECTED

## 2018-09-08 NOTE — Progress Notes (Signed)
Ms. Mcgurn,  This test is negative for an infection called C difficile - which is one infection that can cause diarrhea.  The other test is pending.  How are you feeling?  Still having diarrhea, cramps, fever and cough?  Gatha Mayer, MD, [facility]

## 2018-09-13 ENCOUNTER — Telehealth: Payer: Self-pay | Admitting: Internal Medicine

## 2018-09-13 NOTE — Telephone Encounter (Signed)
Pt is a patient of Dr. Henrene Pastor had a phone visit last week 09-06-18 with Dr. Carlean Purl. She was told to continue to take hyoscyamine. She had a few from

## 2018-09-14 MED ORDER — HYOSCYAMINE SULFATE 0.125 MG PO TBDP: 0.1250 mg | ORAL_TABLET | ORAL | 1 refills | Status: DC | PRN

## 2018-10-27 ENCOUNTER — Other Ambulatory Visit: Payer: Self-pay

## 2018-10-27 MED ORDER — HYOSCYAMINE SULFATE 0.125 MG PO TBDP: 0.1250 mg | ORAL_TABLET | ORAL | 1 refills | Status: DC | PRN

## 2018-10-27 NOTE — Telephone Encounter (Signed)
Patient seen via recent telehealth visit by Dr Carlean Purl and told to take the hyoscyamine 0.125 mg QID prn cramping. Sent in her refill.

## 2018-11-19 ENCOUNTER — Emergency Department (HOSPITAL_COMMUNITY): Payer: Medicare Other

## 2018-11-19 ENCOUNTER — Other Ambulatory Visit: Payer: Self-pay

## 2018-11-19 ENCOUNTER — Inpatient Hospital Stay (HOSPITAL_COMMUNITY)
Admission: EM | Admit: 2018-11-19 | Discharge: 2018-11-21 | DRG: 086 | Disposition: A | Discharge status: Home / Self Care | Payer: Medicare Other | Attending: Internal Medicine | Admitting: Internal Medicine

## 2018-11-19 ENCOUNTER — Encounter (HOSPITAL_COMMUNITY): Payer: Self-pay | Admitting: *Deleted

## 2018-11-19 DIAGNOSIS — S06360A Traumatic hemorrhage of cerebrum, unspecified, without loss of consciousness, initial encounter: Secondary | ICD-10-CM

## 2018-11-19 DIAGNOSIS — S199XXA Unspecified injury of neck, initial encounter: Secondary | ICD-10-CM | POA: Diagnosis not present

## 2018-11-19 DIAGNOSIS — Z79899 Other long term (current) drug therapy: Secondary | ICD-10-CM

## 2018-11-19 DIAGNOSIS — S065X1A Traumatic subdural hemorrhage with loss of consciousness of 30 minutes or less, initial encounter: Secondary | ICD-10-CM | POA: Diagnosis present

## 2018-11-19 DIAGNOSIS — S0219XA Other fracture of base of skull, initial encounter for closed fracture: Secondary | ICD-10-CM | POA: Diagnosis present

## 2018-11-19 DIAGNOSIS — Y92009 Unspecified place in unspecified non-institutional (private) residence as the place of occurrence of the external cause: Secondary | ICD-10-CM

## 2018-11-19 DIAGNOSIS — I629 Nontraumatic intracranial hemorrhage, unspecified: Secondary | ICD-10-CM

## 2018-11-19 DIAGNOSIS — R404 Transient alteration of awareness: Secondary | ICD-10-CM | POA: Diagnosis not present

## 2018-11-19 DIAGNOSIS — S020XXA Fracture of vault of skull, initial encounter for closed fracture: Secondary | ICD-10-CM | POA: Diagnosis present

## 2018-11-19 DIAGNOSIS — R413 Other amnesia: Secondary | ICD-10-CM | POA: Diagnosis present

## 2018-11-19 DIAGNOSIS — R4020 Unspecified coma: Secondary | ICD-10-CM | POA: Diagnosis not present

## 2018-11-19 DIAGNOSIS — Z03818 Encounter for observation for suspected exposure to other biological agents ruled out: Secondary | ICD-10-CM | POA: Diagnosis not present

## 2018-11-19 DIAGNOSIS — Z79890 Hormone replacement therapy (postmenopausal): Secondary | ICD-10-CM

## 2018-11-19 DIAGNOSIS — G9389 Other specified disorders of brain: Secondary | ICD-10-CM | POA: Diagnosis present

## 2018-11-19 DIAGNOSIS — Z23 Encounter for immunization: Secondary | ICD-10-CM

## 2018-11-19 DIAGNOSIS — F10129 Alcohol abuse with intoxication, unspecified: Secondary | ICD-10-CM | POA: Diagnosis present

## 2018-11-19 DIAGNOSIS — W19XXXA Unspecified fall, initial encounter: Secondary | ICD-10-CM | POA: Diagnosis not present

## 2018-11-19 DIAGNOSIS — S0281XA Fracture of other specified skull and facial bones, right side, initial encounter for closed fracture: Secondary | ICD-10-CM | POA: Diagnosis not present

## 2018-11-19 DIAGNOSIS — R51 Headache: Secondary | ICD-10-CM | POA: Diagnosis not present

## 2018-11-19 DIAGNOSIS — S066X1A Traumatic subarachnoid hemorrhage with loss of consciousness of 30 minutes or less, initial encounter: Secondary | ICD-10-CM | POA: Diagnosis not present

## 2018-11-19 DIAGNOSIS — Y906 Blood alcohol level of 120-199 mg/100 ml: Secondary | ICD-10-CM | POA: Diagnosis present

## 2018-11-19 DIAGNOSIS — S0101XA Laceration without foreign body of scalp, initial encounter: Secondary | ICD-10-CM | POA: Diagnosis present

## 2018-11-19 DIAGNOSIS — Z6841 Body Mass Index (BMI) 40.0 and over, adult: Secondary | ICD-10-CM

## 2018-11-19 DIAGNOSIS — E876 Hypokalemia: Secondary | ICD-10-CM | POA: Diagnosis present

## 2018-11-19 DIAGNOSIS — W010XXA Fall on same level from slipping, tripping and stumbling without subsequent striking against object, initial encounter: Secondary | ICD-10-CM | POA: Diagnosis present

## 2018-11-19 DIAGNOSIS — H9191 Unspecified hearing loss, right ear: Secondary | ICD-10-CM | POA: Diagnosis present

## 2018-11-19 DIAGNOSIS — Y9301 Activity, walking, marching and hiking: Secondary | ICD-10-CM | POA: Diagnosis present

## 2018-11-19 DIAGNOSIS — Z1159 Encounter for screening for other viral diseases: Secondary | ICD-10-CM

## 2018-11-19 DIAGNOSIS — R55 Syncope and collapse: Secondary | ICD-10-CM | POA: Diagnosis not present

## 2018-11-19 DIAGNOSIS — R410 Disorientation, unspecified: Secondary | ICD-10-CM | POA: Diagnosis not present

## 2018-11-19 DIAGNOSIS — H9221 Otorrhagia, right ear: Secondary | ICD-10-CM | POA: Diagnosis present

## 2018-11-19 LAB — CBC
HCT: 41.5 % (ref 36.0–46.0)
Hemoglobin: 13.7 g/dL (ref 12.0–15.0)
MCH: 30.1 pg (ref 26.0–34.0)
MCHC: 33 g/dL (ref 30.0–36.0)
MCV: 91.2 fL (ref 80.0–100.0)
Platelets: 294 10*3/uL (ref 150–400)
RBC: 4.55 MIL/uL (ref 3.87–5.11)
RDW: 14.2 % (ref 11.5–15.5)
WBC: 8.6 10*3/uL (ref 4.0–10.5)
nRBC: 0 % (ref 0.0–0.2)

## 2018-11-19 LAB — BASIC METABOLIC PANEL
Anion gap: 10 (ref 5–15)
BUN: 14 mg/dL (ref 8–23)
CO2: 26 mmol/L (ref 22–32)
Calcium: 8.7 mg/dL — ABNORMAL LOW (ref 8.9–10.3)
Chloride: 105 mmol/L (ref 98–111)
Creatinine, Ser: 0.51 mg/dL (ref 0.44–1.00)
GFR calc Af Amer: >60 mL/min (ref >60)
GFR calc non Af Amer: >60 mL/min (ref >60)
Glucose, Bld: 116 mg/dL — ABNORMAL HIGH (ref 70–99)
Potassium: 2.8 mmol/L — ABNORMAL LOW (ref 3.5–5.1)
Sodium: 141 mmol/L (ref 135–145)

## 2018-11-19 LAB — ETHANOL: Alcohol, Ethyl (B): 182 mg/dL — ABNORMAL HIGH (ref <10)

## 2018-11-19 MED ORDER — SODIUM CHLORIDE 0.9 % IV SOLN
1000.0000 mL | INTRAVENOUS | Status: DC
  Administered 2018-11-19 – 2018-11-20 (×2): 1000 mL via INTRAVENOUS

## 2018-11-19 MED ORDER — SODIUM CHLORIDE 0.9 % IV BOLUS (SEPSIS): 500.0000 mL | Freq: Once | INTRAVENOUS | Status: DC

## 2018-11-19 MED ORDER — TETANUS-DIPHTH-ACELL PERTUSSIS 5-2.5-18.5 LF-MCG/0.5 IM SUSP
0.5000 mL | Freq: Once | INTRAMUSCULAR | Status: AC
  Administered 2018-11-20: 0.5 mL via INTRAMUSCULAR
  Filled 2018-11-19: qty 0.5

## 2018-11-19 MED ORDER — LIDOCAINE-EPINEPHRINE (PF) 2 %-1:200000 IJ SOLN
10.0000 mL | Freq: Once | INTRAMUSCULAR | Status: AC
  Administered 2018-11-20: 10 mL via INTRADERMAL
  Filled 2018-11-19: qty 20

## 2018-11-19 NOTE — ED Provider Notes (Signed)
Patient presented as a level 2 trauma.  Patient was at home.  She may have had a glass of wine to drink with her husband.  She was in another room when he heard a loud noise.  He found her on the floor unconscious.  Patient had an obvious laceration to the back of her head.  EMS was called.  Patient is now awake and alert but does not recall any of the events.  She has been terribly asking the same questions. Physical Exam  There were no vitals taken for this visit.  Physical Exam HENT:     Head:     Comments: Blood noted in the posterior scalp, dressing is in place, laceration not visualized    Nose: Nose normal.  Eyes:     Pupils: Pupils are equal, round, and reactive to light.  Neck:     Musculoskeletal: No neck rigidity.     Comments: Cervical collar in place, Cardiovascular:     Rate and Rhythm: Normal rate and regular rhythm.     Pulses: Normal pulses.     Heart sounds: Normal heart sounds.  Pulmonary:     Effort: Pulmonary effort is normal.     Breath sounds: Normal breath sounds.  Abdominal:     General: Abdomen is flat. Bowel sounds are normal. There is no distension.     Palpations: There is no mass.     Tenderness: There is no abdominal tenderness.     Hernia: No hernia is present.  Musculoskeletal: Normal range of motion.     Right shoulder: She exhibits no tenderness, no bony tenderness and no swelling.     Left shoulder: She exhibits no tenderness, no bony tenderness and no swelling.     Right wrist: She exhibits no tenderness, no bony tenderness and no swelling.     Left wrist: She exhibits no tenderness, no bony tenderness and no swelling.     Right hip: She exhibits normal range of motion, no tenderness, no bony tenderness and no swelling.     Left hip: She exhibits normal range of motion, no tenderness and no bony tenderness.     Right ankle: She exhibits no swelling. No tenderness.     Left ankle: She exhibits no swelling. No tenderness.     Cervical back: She  exhibits no tenderness, no bony tenderness and no swelling.     Thoracic back: She exhibits no tenderness, no bony tenderness and no swelling.     Lumbar back: She exhibits no tenderness, no bony tenderness and no swelling.     Comments: No tenderness to palpation in all 4 extremities, no tenderness palpation along the cervical thoracic or lumbar spine, pelvis is stable  Skin:    Comments: Superficial abrasions, scratches  Neurological:     General: No focal deficit present.     Mental Status: She is alert. She is disoriented.     Cranial Nerves: No cranial nerve deficit.     Motor: No weakness.     ED Course/Procedures     Procedures  MDM  Medical screening exam initiated on arrival.  Unclear if the patient had a syncopal episode or mechanical fall.  She clearly is confused and has a head injury.  She will need a CT scan of her head.  Laboratory tests and EKG is also been ordered.  Care will be turned over to oncoming MD.      Dorie Rank, MD 11/19/18 2259

## 2018-11-19 NOTE — ED Notes (Signed)
The pt difficult she does not want to answer questions.

## 2018-11-19 NOTE — Progress Notes (Signed)
Chaplain responded to page at 10:30 PM.  Female patient in [place]. Level 2.  Fall with laceration on back of head. ETOH.  ED bridge said chaplain not needed at this time.  Will be available.  Rev. Tamsen Snider Pager [phone]

## 2018-11-19 NOTE — ED Triage Notes (Signed)
The pt was brought in by gems from home  She had fallen at home striking her head the back of her head.  She had been drinking heavily  The husband found her in the floor unconscious.  When ems arrived pt talking but asking questions repeatedly,  On arrival both pupils equal and react  approx size 3.0  Equal and react

## 2018-11-19 NOTE — ED Notes (Signed)
Warm blankets placed on the pt 

## 2018-11-19 NOTE — ED Notes (Signed)
Husband, marc- [phone] for updates

## 2018-11-19 NOTE — ED Notes (Signed)
bp 119/73 p76

## 2018-11-19 NOTE — ED Notes (Signed)
Pt returned from c-t  She has called her husband on the phone

## 2018-11-19 NOTE — ED Provider Notes (Signed)
[facility] EMERGENCY DEPARTMENT Provider Note   CSN: [id number] Arrival date & time: 11/19/18  2240    History   Chief Complaint Chief Complaint  Patient presents with   Trauma    HPI Sharon Fox is a 68 y.o. female.     68 yo F arrived as a level 2 trauma because of a fall.  Patient is unsure exactly why she fell.  Struck the back of her head and has a laceration there.  Patient is somewhat confused to the scenario.  Some history of alcohol use tonight.  The history is provided by the patient.  Trauma   Current symptoms:      Associated symptoms:            Reports headache.            Denies chest pain, nausea and vomiting.  Illness Severity:  Moderate Onset quality:  Gradual Duration:  1 hour Timing:  Constant Progression:  Unchanged Chronicity:  New Associated symptoms: headaches   Associated symptoms: no chest pain, no congestion, no fever, no myalgias, no nausea, no rhinorrhea, no shortness of breath, no vomiting and no wheezing     History reviewed. No pertinent past medical history.  There are no active problems to display for this patient.   History reviewed. No pertinent surgical history.   OB History   No obstetric history on file.      Home Medications    Prior to Admission medications   Medication Sig Start Date End Date Taking? Authorizing Provider  cholecalciferol (VITAMIN D3) 25 MCG (1000 UT) tablet Take 1,000 Units by mouth daily.   Yes [provider]  ESTRING 2 MG vaginal ring Place 1 application vaginally every 3 (three) months. 09/13/18  Yes [provider]  hyoscyamine (ANASPAZ) 0.125 MG TBDP disintergrating tablet Place 1 tablet under the tongue 2 (two) times daily as needed for spasms. 11/16/18  Yes [provider]  Multiple Vitamin (MULTIVITAMIN WITH MINERALS) TABS tablet Take 1 tablet by mouth daily.   Yes [provider]  Probiotic Product (ALIGN PO) Take 1 tablet by  mouth daily.   Yes [provider]  zolpidem (AMBIEN) 5 MG tablet Take 2.5 mg by mouth at bedtime. 10/28/18  Yes [provider]    Family History No family history on file.  Social History Social History   Tobacco Use   Smoking status: Never Smoker   Smokeless tobacco: Never Used  Substance Use Topics   Alcohol use: Yes   Drug use: Not on file     Allergies   Ciprofloxacin   Review of Systems Review of Systems  Constitutional: Negative for chills and fever.  HENT: Negative for congestion and rhinorrhea.   Eyes: Negative for redness and visual disturbance.  Respiratory: Negative for shortness of breath and wheezing.   Cardiovascular: Negative for chest pain and palpitations.  Gastrointestinal: Negative for nausea and vomiting.  Genitourinary: Negative for dysuria and urgency.  Musculoskeletal: Negative for arthralgias and myalgias.  Skin: Negative for pallor and wound.  Neurological: Positive for headaches. Negative for dizziness.     Physical Exam Updated Vital Signs BP (!) 144/59    Pulse 94    Temp 97.7 F (36.5 C)    Resp 18    Ht 5\' 2"  (1.575 m)    Wt (!) 139 kg    SpO2 100%    BMI 56.05 kg/m   Physical Exam Vitals signs and nursing note reviewed.  Constitutional:      General: She is not in acute distress.    Appearance: She is well-developed. She is not diaphoretic.  HENT:     Head: Normocephalic and atraumatic.      Comments: Blood obscuring view in the right EOC. Eyes:     Pupils: Pupils are equal, round, and reactive to light.  Neck:     Musculoskeletal: Normal range of motion and neck supple.  Cardiovascular:     Rate and Rhythm: Normal rate and regular rhythm.     Heart sounds: No murmur. No friction rub. No gallop.   Pulmonary:     Effort: Pulmonary effort is normal.     Breath sounds: No wheezing or rales.  Abdominal:     General: There is no distension.     Palpations: Abdomen is soft.     Tenderness: There is no  abdominal tenderness.  Musculoskeletal:        General: No tenderness.  Skin:    General: Skin is warm and dry.  Neurological:     Mental Status: She is alert and oriented to person, place, and time.     Cranial Nerves: Cranial nerves are intact.     Sensory: Sensation is intact.     Motor: Motor function is intact.     Coordination: Coordination is intact.  Psychiatric:        Behavior: Behavior normal.      ED Treatments / Results  Labs (all labs ordered are listed, but only abnormal results are displayed) Labs Reviewed  BASIC METABOLIC PANEL - Abnormal; Notable for the following components:      Result Value   Potassium 2.8 (*)    Glucose, Bld 116 (*)    Calcium 8.7 (*)    All other components within normal limits  ETHANOL - Abnormal; Notable for the following components:   Alcohol, Ethyl (B) 182 (*)    All other components within normal limits  SARS CORONAVIRUS 2 (HOSPITAL ORDER, [facility])  CBC    EKG EKG Interpretation  Date/Time:  Friday November 19 2018 22:57:50 EDT Ventricular Rate:  76 PR Interval:    QRS Duration: 85 QT Interval:  412 QTC Calculation: 464 R Axis:   60 Text Interpretation:  Sinus rhythm No old tracing to compare Confirmed by Deno Etienne [phone]) on 11/19/2018 11:03:32 PM   Radiology Ct Head Wo Contrast  Result Date: 11/19/2018 CLINICAL DATA:  Head trauma, minor, GCS>=13, high clinical risk, initial exam; C-spine trauma, high clinical risk (NEXUS/CCR). Fall at home striking back of head. EXAM: CT HEAD WITHOUT CONTRAST CT CERVICAL SPINE WITHOUT CONTRAST TECHNIQUE: Multidetector CT imaging of the head and cervical spine was performed following the standard protocol without intravenous contrast. Multiplanar CT image reconstructions of the cervical spine were also generated. COMPARISON:  None. FINDINGS: CT HEAD FINDINGS Brain: Left temporal hemorrhagic contusion measures 6 mm abutting the mastoid air cells. Additional vague  intraparenchymal densities suspicious for additional contusions in the temporal occipital lobe. Multifocal subarachnoid hemorrhage in the right temporal, left temporal, and inferior frontal lobes. Foci of pneumocephalus subjacent to skull fracture. Left temporal subdural measures 4 mm, within additional 12 x 8 mm extra-axial hemorrhage in the left frontal lobe, likely additional subdural. No midline shift. No hydrocephalus. Basilar cisterns are patent. Vascular: No hyperdense vessel. Skull: Complex centrally nondisplaced right temporal bone fracture extends towards the parietal bone and involves the mastoid air cells. Small foci of subjacent pneumocephalus and soft tissue  air. Mastoid air cell opacification on the right. Fracture extends to the right skull base with fluid levels in the sphenoid sinus. Fracture abuts the carotid canal. Sinuses/Orbits: Fluid levels in the right sphenoid sinus. Scattered opacification of ethmoid air cells. Other: Right parietal. CT CERVICAL SPINE FINDINGS Alignment: Trace retrolisthesis of C5 on C6 is likely degenerative. No traumatic subluxation. Skull base and vertebrae: No acute fracture. Vertebral body heights are maintained. The dens and skull base are intact. Soft tissues and spinal canal: No prevertebral fluid or swelling. No visible canal hematoma. Disc levels: Disc space narrowing and endplate spurring most prominent at C5-C6. Multilevel facet arthropathy. Upper chest: Emphysema. No acute findings. Other: None. IMPRESSION: 1. Complex right temporal bone fracture involving the mastoid air cells tracking to the skull base. Fracture extends to the region of the carotid canals, recommend CTA to exclude vascular injury. Foci of pneumocephalus subjacent to temporal fracture. 2. Small intraparenchymal hemorrhage in the right temporal lobe, additional probable temporal contusions. 3. Multifocal scattered subarachnoid hemorrhage both right and left temporal as well as left frontal. 4.  Left temporal subdural measuring 4 mm. Additional extra-axial hemorrhage in the left frontal region measures 8 mm in thickness, favoring focal subdural over epidural. 5. No fracture or subluxation of the cervical spine. Critical Value/emergent results were called by telephone at the time of interpretation on 11/19/2018 at 11:52 pm to [place] , who verbally acknowledged these results. Electronically Signed   By: Keith Rake M.D.   On: 11/19/2018 23:57   Ct Cervical Spine Wo Contrast  Result Date: 11/19/2018 CLINICAL DATA:  Head trauma, minor, GCS>=13, high clinical risk, initial exam; C-spine trauma, high clinical risk (NEXUS/CCR). Fall at home striking back of head. EXAM: CT HEAD WITHOUT CONTRAST CT CERVICAL SPINE WITHOUT CONTRAST TECHNIQUE: Multidetector CT imaging of the head and cervical spine was performed following the standard protocol without intravenous contrast. Multiplanar CT image reconstructions of the cervical spine were also generated. COMPARISON:  None. FINDINGS: CT HEAD FINDINGS Brain: Left temporal hemorrhagic contusion measures 6 mm abutting the mastoid air cells. Additional vague intraparenchymal densities suspicious for additional contusions in the temporal occipital lobe. Multifocal subarachnoid hemorrhage in the right temporal, left temporal, and inferior frontal lobes. Foci of pneumocephalus subjacent to skull fracture. Left temporal subdural measures 4 mm, within additional 12 x 8 mm extra-axial hemorrhage in the left frontal lobe, likely additional subdural. No midline shift. No hydrocephalus. Basilar cisterns are patent. Vascular: No hyperdense vessel. Skull: Complex centrally nondisplaced right temporal bone fracture extends towards the parietal bone and involves the mastoid air cells. Small foci of subjacent pneumocephalus and soft tissue air. Mastoid air cell opacification on the right. Fracture extends to the right skull base with fluid levels in the sphenoid sinus. Fracture  abuts the carotid canal. Sinuses/Orbits: Fluid levels in the right sphenoid sinus. Scattered opacification of ethmoid air cells. Other: Right parietal. CT CERVICAL SPINE FINDINGS Alignment: Trace retrolisthesis of C5 on C6 is likely degenerative. No traumatic subluxation. Skull base and vertebrae: No acute fracture. Vertebral body heights are maintained. The dens and skull base are intact. Soft tissues and spinal canal: No prevertebral fluid or swelling. No visible canal hematoma. Disc levels: Disc space narrowing and endplate spurring most prominent at C5-C6. Multilevel facet arthropathy. Upper chest: Emphysema. No acute findings. Other: None. IMPRESSION: 1. Complex right temporal bone fracture involving the mastoid air cells tracking to the skull base. Fracture extends to the region of the carotid canals, recommend CTA to exclude vascular injury.  Foci of pneumocephalus subjacent to temporal fracture. 2. Small intraparenchymal hemorrhage in the right temporal lobe, additional probable temporal contusions. 3. Multifocal scattered subarachnoid hemorrhage both right and left temporal as well as left frontal. 4. Left temporal subdural measuring 4 mm. Additional extra-axial hemorrhage in the left frontal region measures 8 mm in thickness, favoring focal subdural over epidural. 5. No fracture or subluxation of the cervical spine. Critical Value/emergent results were called by telephone at the time of interpretation on 11/19/2018 at 11:52 pm to [place] , who verbally acknowledged these results. Electronically Signed   By: Keith Rake M.D.   On: 11/19/2018 23:57    Procedures .Marland KitchenLaceration Repair  Date/Time: 11/20/2018 2:00 AM Performed by: Deno Etienne, DO Authorized by: Deno Etienne, DO   Consent:    Consent obtained:  Verbal   Consent given by:  Patient   Risks discussed:  Infection, pain, poor cosmetic result and poor wound healing   Alternatives discussed:  No treatment, delayed treatment and  observation Anesthesia (see MAR for exact dosages):    Anesthesia method:  Local infiltration   Local anesthetic:  Lidocaine 2% WITH epi Laceration details:    Location:  Scalp   Scalp location:  R parietal   Length (cm):  5.7 Repair type:    Repair type:  Simple Pre-procedure details:    Preparation:  Patient was prepped and draped in usual sterile fashion Exploration:    Wound exploration: entire depth of wound probed and visualized     Contaminated: no   Treatment:    Area cleansed with:  Saline   Amount of cleaning:  Standard   Irrigation solution:  Sterile saline   Irrigation volume:  20   Irrigation method:  Syringe Skin repair:    Repair method:  Staples   Number of staples:  4 Approximation:    Approximation:  Close Post-procedure details:    Dressing:  Open (no dressing)   Patient tolerance of procedure:  Tolerated well, no immediate complications   (including critical care time)  Medications Ordered in ED Medications  sodium chloride 0.9 % bolus 500 mL (has no administration in time range)    Followed by  0.9 %  sodium chloride infusion (1,000 mLs Intravenous New Bag/Given 11/19/18 2352)  Tdap (BOOSTRIX) injection 0.5 mL (0.5 mLs Intramuscular Given 11/20/18 0142)  lidocaine-EPINEPHrine (XYLOCAINE W/EPI) 2 %-1:200000 (PF) injection 10 mL (10 mLs Intradermal Given 11/20/18 0035)  iohexol (OMNIPAQUE) 350 MG/ML injection 100 mL (100 mLs Intravenous Contrast Given 11/20/18 0039)  potassium chloride SA (K-DUR) CR tablet 40 mEq (40 mEq Oral Given 11/20/18 0156)  magnesium oxide (MAG-OX) tablet 800 mg (800 mg Oral Given 11/20/18 0154)     Initial Impression / Assessment and Plan / ED Course  I have reviewed the triage vital signs and the nursing notes.  Pertinent labs & imaging results that were available during my care of the patient were reviewed by me and considered in my medical decision making (see chart for details).        68 yo F with a cc of a fall, unsure  why she fell, will obtain CT head c spine, lab work,ecg.    CT of the head with multiple foci of hemorrhage, left temporal subdural, scattered subarachnoid, small intraparenchymal hemorrhage.  I discussed the case with Dr. Venetia Constable, neurosurgery recommended hospitalist admission and he would see the patient in the morning.  I discussed the case with Dr. [name], ENT recommended follow-up in the office in 1  to 2 weeks for hearing evaluation.  CT angiogram of the head neck is negative.  Will discuss with hospitalist for admission.  CRITICAL CARE Performed by: Cecilio Asper   Total critical care time: 80 minutes  Critical care time was exclusive of separately billable procedures and treating other patients.  Critical care was necessary to treat or prevent imminent or life-threatening deterioration.  Critical care was time spent personally by me on the following activities: development of treatment plan with patient and/or surrogate as well as nursing, discussions with consultants, evaluation of patient's response to treatment, examination of patient, obtaining history from patient or surrogate, ordering and performing treatments and interventions, ordering and review of laboratory studies, ordering and review of radiographic studies, pulse oximetry and re-evaluation of patient's condition.  The patients results and plan were reviewed and discussed.   Any x-rays performed were independently reviewed by myself.   Differential diagnosis were considered with the presenting HPI.  Medications  sodium chloride 0.9 % bolus 500 mL (has no administration in time range)    Followed by  0.9 %  sodium chloride infusion (1,000 mLs Intravenous New Bag/Given 11/19/18 2352)  Tdap (BOOSTRIX) injection 0.5 mL (0.5 mLs Intramuscular Given 11/20/18 0142)  lidocaine-EPINEPHrine (XYLOCAINE W/EPI) 2 %-1:200000 (PF) injection 10 mL (10 mLs Intradermal Given 11/20/18 0035)  iohexol (OMNIPAQUE) 350 MG/ML  injection 100 mL (100 mLs Intravenous Contrast Given 11/20/18 0039)  potassium chloride SA (K-DUR) CR tablet 40 mEq (40 mEq Oral Given 11/20/18 0156)  magnesium oxide (MAG-OX) tablet 800 mg (800 mg Oral Given 11/20/18 0154)    Vitals:   11/19/18 2330 11/19/18 2345 11/20/18 0000 11/20/18 0104  BP: (!) 99/52 117/62 121/78 (!) 144/59  Pulse: 73 78 89 94  Resp: 16 20 18 18   Temp:      SpO2: 93% 98% 98% 100%  Weight:      Height:        Final diagnoses:  Traumatic hemorrhage of cerebrum without loss of consciousness, unspecified laterality, initial encounter ([facility])  Closed fracture of temporal bone, initial encounter [facility])    Admission/ observation were discussed with the admitting physician, patient and/or family and they are comfortable with the plan.    Final Clinical Impressions(s) / ED Diagnoses   Final diagnoses:  Traumatic hemorrhage of cerebrum without loss of consciousness, unspecified laterality, initial encounter [facility])  Closed fracture of temporal bone, initial encounter [facility])    ED Discharge Orders    None       Deno Etienne, DO 11/21/18 0703

## 2018-11-19 NOTE — ED Triage Notes (Signed)
The pt has a laceration to the back of her head bleeding controlled at present

## 2018-11-19 NOTE — ED Notes (Signed)
Pt returned from ct

## 2018-11-20 ENCOUNTER — Other Ambulatory Visit: Payer: Self-pay

## 2018-11-20 ENCOUNTER — Emergency Department (HOSPITAL_COMMUNITY): Payer: Medicare Other

## 2018-11-20 ENCOUNTER — Encounter (HOSPITAL_COMMUNITY): Payer: Self-pay

## 2018-11-20 ENCOUNTER — Inpatient Hospital Stay (HOSPITAL_COMMUNITY): Payer: Medicare Other

## 2018-11-20 DIAGNOSIS — Z6841 Body Mass Index (BMI) 40.0 and over, adult: Secondary | ICD-10-CM | POA: Diagnosis not present

## 2018-11-20 DIAGNOSIS — S066X1A Traumatic subarachnoid hemorrhage with loss of consciousness of 30 minutes or less, initial encounter: Secondary | ICD-10-CM | POA: Diagnosis not present

## 2018-11-20 DIAGNOSIS — S0219XA Other fracture of base of skull, initial encounter for closed fracture: Secondary | ICD-10-CM | POA: Diagnosis present

## 2018-11-20 DIAGNOSIS — R413 Other amnesia: Secondary | ICD-10-CM | POA: Diagnosis present

## 2018-11-20 DIAGNOSIS — E876 Hypokalemia: Secondary | ICD-10-CM | POA: Diagnosis present

## 2018-11-20 DIAGNOSIS — Y92009 Unspecified place in unspecified non-institutional (private) residence as the place of occurrence of the external cause: Secondary | ICD-10-CM | POA: Diagnosis not present

## 2018-11-20 DIAGNOSIS — Y9301 Activity, walking, marching and hiking: Secondary | ICD-10-CM | POA: Diagnosis present

## 2018-11-20 DIAGNOSIS — I629 Nontraumatic intracranial hemorrhage, unspecified: Secondary | ICD-10-CM | POA: Diagnosis not present

## 2018-11-20 DIAGNOSIS — S0101XA Laceration without foreign body of scalp, initial encounter: Secondary | ICD-10-CM | POA: Diagnosis not present

## 2018-11-20 DIAGNOSIS — S06330A Contusion and laceration of cerebrum, unspecified, without loss of consciousness, initial encounter: Secondary | ICD-10-CM | POA: Diagnosis not present

## 2018-11-20 DIAGNOSIS — S066X0A Traumatic subarachnoid hemorrhage without loss of consciousness, initial encounter: Secondary | ICD-10-CM | POA: Diagnosis not present

## 2018-11-20 DIAGNOSIS — F10129 Alcohol abuse with intoxication, unspecified: Secondary | ICD-10-CM | POA: Diagnosis present

## 2018-11-20 DIAGNOSIS — W010XXA Fall on same level from slipping, tripping and stumbling without subsequent striking against object, initial encounter: Secondary | ICD-10-CM | POA: Diagnosis present

## 2018-11-20 DIAGNOSIS — Z1159 Encounter for screening for other viral diseases: Secondary | ICD-10-CM | POA: Diagnosis not present

## 2018-11-20 DIAGNOSIS — Y906 Blood alcohol level of 120-199 mg/100 ml: Secondary | ICD-10-CM | POA: Diagnosis present

## 2018-11-20 DIAGNOSIS — Z79899 Other long term (current) drug therapy: Secondary | ICD-10-CM | POA: Diagnosis not present

## 2018-11-20 DIAGNOSIS — Z79890 Hormone replacement therapy (postmenopausal): Secondary | ICD-10-CM | POA: Diagnosis not present

## 2018-11-20 DIAGNOSIS — G9389 Other specified disorders of brain: Secondary | ICD-10-CM | POA: Diagnosis present

## 2018-11-20 DIAGNOSIS — S065X1A Traumatic subdural hemorrhage with loss of consciousness of 30 minutes or less, initial encounter: Secondary | ICD-10-CM | POA: Diagnosis present

## 2018-11-20 DIAGNOSIS — S020XXA Fracture of vault of skull, initial encounter for closed fracture: Secondary | ICD-10-CM | POA: Diagnosis not present

## 2018-11-20 DIAGNOSIS — H9221 Otorrhagia, right ear: Secondary | ICD-10-CM | POA: Diagnosis present

## 2018-11-20 DIAGNOSIS — H9191 Unspecified hearing loss, right ear: Secondary | ICD-10-CM | POA: Diagnosis present

## 2018-11-20 DIAGNOSIS — R51 Headache: Secondary | ICD-10-CM | POA: Diagnosis not present

## 2018-11-20 DIAGNOSIS — Z23 Encounter for immunization: Secondary | ICD-10-CM | POA: Diagnosis not present

## 2018-11-20 LAB — CBC
HCT: 38.5 % (ref 36.0–46.0)
Hemoglobin: 12.8 g/dL (ref 12.0–15.0)
MCH: 29.8 pg (ref 26.0–34.0)
MCHC: 33.2 g/dL (ref 30.0–36.0)
MCV: 89.7 fL (ref 80.0–100.0)
Platelets: 258 10*3/uL (ref 150–400)
RBC: 4.29 MIL/uL (ref 3.87–5.11)
RDW: 14.4 % (ref 11.5–15.5)
WBC: 9.8 10*3/uL (ref 4.0–10.5)
nRBC: 0 % (ref 0.0–0.2)

## 2018-11-20 LAB — COMPREHENSIVE METABOLIC PANEL
ALT: 27 U/L (ref 0–44)
AST: 30 U/L (ref 15–41)
Albumin: 3.4 g/dL — ABNORMAL LOW (ref 3.5–5.0)
Alkaline Phosphatase: 67 U/L (ref 38–126)
Anion gap: 9 (ref 5–15)
BUN: 12 mg/dL (ref 8–23)
CO2: 24 mmol/L (ref 22–32)
Calcium: 8.7 mg/dL — ABNORMAL LOW (ref 8.9–10.3)
Chloride: 106 mmol/L (ref 98–111)
Creatinine, Ser: 0.46 mg/dL (ref 0.44–1.00)
GFR calc Af Amer: >60 mL/min (ref >60)
GFR calc non Af Amer: >60 mL/min (ref >60)
Glucose, Bld: 126 mg/dL — ABNORMAL HIGH (ref 70–99)
Potassium: 4.5 mmol/L (ref 3.5–5.1)
Sodium: 139 mmol/L (ref 135–145)
Total Bilirubin: 0.6 mg/dL (ref 0.3–1.2)
Total Protein: 6.4 g/dL — ABNORMAL LOW (ref 6.5–8.1)

## 2018-11-20 LAB — MAGNESIUM: Magnesium: 2 mg/dL (ref 1.7–2.4)

## 2018-11-20 LAB — RAPID URINE DRUG SCREEN, HOSP PERFORMED
Amphetamines: NOT DETECTED
Barbiturates: NOT DETECTED
Benzodiazepines: NOT DETECTED
Cocaine: NOT DETECTED
Opiates: NOT DETECTED
Tetrahydrocannabinol: NOT DETECTED

## 2018-11-20 LAB — SARS CORONAVIRUS 2 BY RT PCR (HOSPITAL ORDER, PERFORMED IN ~~LOC~~ HOSPITAL LAB): SARS Coronavirus 2: NEGATIVE

## 2018-11-20 MED ORDER — ACETAMINOPHEN 325 MG PO TABS
650.0000 mg | ORAL_TABLET | Freq: Four times a day (QID) | ORAL | Status: DC | PRN
  Administered 2018-11-20 – 2018-11-21 (×5): 650 mg via ORAL
  Filled 2018-11-20 (×5): qty 2

## 2018-11-20 MED ORDER — ADULT MULTIVITAMIN W/MINERALS CH
1.0000 | ORAL_TABLET | Freq: Every day | ORAL | Status: DC
  Administered 2018-11-20 – 2018-11-21 (×2): 1 via ORAL
  Filled 2018-11-20 (×2): qty 1

## 2018-11-20 MED ORDER — POTASSIUM CHLORIDE CRYS ER 20 MEQ PO TBCR
40.0000 meq | EXTENDED_RELEASE_TABLET | Freq: Once | ORAL | Status: AC
  Administered 2018-11-20: 02:00:00 40 meq via ORAL
  Filled 2018-11-20: qty 2

## 2018-11-20 MED ORDER — LORATADINE 10 MG PO TABS
10.0000 mg | ORAL_TABLET | Freq: Every day | ORAL | Status: DC | PRN
  Administered 2018-11-21: 10:00:00 10 mg via ORAL
  Filled 2018-11-20: qty 1

## 2018-11-20 MED ORDER — VITAMIN D 25 MCG (1000 UNIT) PO TABS
1000.0000 [IU] | ORAL_TABLET | Freq: Every day | ORAL | Status: DC
  Administered 2018-11-20 – 2018-11-21 (×2): 1000 [IU] via ORAL
  Filled 2018-11-20 (×2): qty 1

## 2018-11-20 MED ORDER — SODIUM CHLORIDE 0.9 % IV SOLN
INTRAVENOUS | Status: DC
  Administered 2018-11-20 – 2018-11-21 (×2): via INTRAVENOUS

## 2018-11-20 MED ORDER — ZOLPIDEM TARTRATE 5 MG PO TABS
2.5000 mg | ORAL_TABLET | Freq: Every day | ORAL | Status: DC
  Administered 2018-11-20: 2.5 mg via ORAL
  Filled 2018-11-20: qty 1

## 2018-11-20 MED ORDER — HYOSCYAMINE SULFATE 0.125 MG PO TBDP
0.1250 mg | ORAL_TABLET | Freq: Two times a day (BID) | ORAL | Status: DC | PRN
  Administered 2018-11-20: 21:00:00 0.125 mg via SUBLINGUAL
  Filled 2018-11-20 (×3): qty 1

## 2018-11-20 MED ORDER — IOHEXOL 350 MG/ML SOLN
100.0000 mL | Freq: Once | INTRAVENOUS | Status: AC | PRN
  Administered 2018-11-20: 100 mL via INTRAVENOUS

## 2018-11-20 MED ORDER — ESTRADIOL 2 MG VA RING: 2.0000 mg | VAGINAL_RING | VAGINAL | Status: DC

## 2018-11-20 MED ORDER — MAGNESIUM OXIDE 400 (241.3 MG) MG PO TABS
800.0000 mg | ORAL_TABLET | Freq: Once | ORAL | Status: AC
  Administered 2018-11-20: 800 mg via ORAL
  Filled 2018-11-20: qty 2

## 2018-11-20 MED ORDER — SODIUM CHLORIDE 0.9 % IV SOLN
INTRAVENOUS | Status: DC
  Administered 2018-11-20: 19:00:00 via INTRAVENOUS

## 2018-11-20 MED ORDER — ONDANSETRON HCL 4 MG PO TABS: 4.0000 mg | ORAL_TABLET | Freq: Four times a day (QID) | ORAL | Status: DC | PRN

## 2018-11-20 MED ORDER — ONDANSETRON HCL 4 MG/2ML IJ SOLN: 4.0000 mg | Freq: Four times a day (QID) | INTRAMUSCULAR | Status: DC | PRN

## 2018-11-20 MED ORDER — SACCHAROMYCES BOULARDII 250 MG PO CAPS
250.0000 mg | ORAL_CAPSULE | Freq: Every day | ORAL | Status: DC
  Filled 2018-11-20 (×2): qty 1

## 2018-11-20 NOTE — Consult Note (Signed)
Reason for Consult: Traumatic brain injury Referring Physician: Emergency department  Sharon Fox is an 68 y.o. female.  HPI: 68 year old female status post likely mechanical fall yesterday.  Patient struck her head.  She was initially confused and amnestic to the events surrounding her fall.  Currently she complains of mild headache.  She notes decreased hearing in her right ear.  She feels a little dizzy when up walking around.  She has no other complaints.  History reviewed. No pertinent past medical history.  History reviewed. No pertinent surgical history.  History reviewed. No pertinent family history.  Social History:  reports that she has never smoked. She has never used smokeless tobacco. She reports current alcohol use. No history on file for drug.  Allergies:  Allergies  Allergen Reactions  . Ciprofloxacin Rash    Medications: I have reviewed the patient's current medications.  Results for orders placed or performed during the hospital encounter of 11/19/18 (from the past 48 hour(s))  CBC     Status: None   Collection Time: 11/19/18 10:55 PM  Result Value Ref Range   WBC 8.6 4.0 - 10.5 K/uL   RBC 4.55 3.87 - 5.11 MIL/uL   Hemoglobin 13.7 12.0 - 15.0 g/dL   HCT 41.5 36.0 - 46.0 %   MCV 91.2 80.0 - 100.0 fL   MCH 30.1 26.0 - 34.0 pg   MCHC 33.0 30.0 - 36.0 g/dL   RDW 14.2 11.5 - 15.5 %   Platelets 294 150 - 400 K/uL   nRBC 0.0 0.0 - 0.2 %    Comment: Performed at [facility], [place] [address], [place], [place] [address]  Basic metabolic panel     Status: Abnormal   Collection Time: 11/19/18 10:55 PM  Result Value Ref Range   Sodium 141 135 - 145 mmol/L   Potassium 2.8 (L) 3.5 - 5.1 mmol/L   Chloride 105 98 - 111 mmol/L   CO2 26 22 - 32 mmol/L   Glucose, Bld 116 (H) 70 - 99 mg/dL   BUN 14 8 - 23 mg/dL   Creatinine, Ser 0.51 0.44 - 1.00 mg/dL   Calcium 8.7 (L) 8.9 - 10.3 mg/dL   GFR calc non Af Amer >60 >60 mL/min   GFR calc Af Amer >60 >60 mL/min    Anion gap 10 5 - 15    Comment: Performed at [facility], [place] [address], [place], [place] [address]  Ethanol     Status: Abnormal   Collection Time: 11/19/18 10:55 PM  Result Value Ref Range   Alcohol, Ethyl (B) 182 (H) <10 mg/dL    Comment: (NOTE) Lowest detectable limit for serum alcohol is 10 mg/dL. For medical purposes only. Performed at [facility], [place] [address], [place], [place] [address]   SARS Coronavirus 2 (CEPHEID- Performed in [facility] lab), Hosp Order     Status: None   Collection Time: 11/20/18  1:40 AM   Specimen: Nasopharyngeal Swab  Result Value Ref Range   SARS Coronavirus 2 NEGATIVE NEGATIVE    Comment: (NOTE) If result is NEGATIVE SARS-CoV-2 target nucleic acids are NOT DETECTED. The SARS-CoV-2 RNA is generally detectable in upper and lower  respiratory specimens during the acute phase of infection. The lowest  concentration of SARS-CoV-2 viral copies this assay can detect is 250  copies / mL. A negative result does not preclude SARS-CoV-2 infection  and should not be used as the sole basis for treatment or  other  patient management decisions.  A negative result may occur with  improper specimen collection / handling, submission of specimen other  than nasopharyngeal swab, presence of viral mutation(s) within the  areas targeted by this assay, and inadequate number of viral copies  (<250 copies / mL). A negative result must be combined with clinical  observations, patient history, and epidemiological information. If result is POSITIVE SARS-CoV-2 target nucleic acids are DETECTED. The SARS-CoV-2 RNA is generally detectable in upper and lower  respiratory specimens dur ing the acute phase of infection.  Positive  results are indicative of active infection with SARS-CoV-2.  Clinical  correlation with patient history and other diagnostic information is  necessary to determine patient infection status.  Positive results do  not rule  out bacterial infection or co-infection with other viruses. If result is PRESUMPTIVE POSTIVE SARS-CoV-2 nucleic acids MAY BE PRESENT.   A presumptive positive result was obtained on the submitted specimen  and confirmed on repeat testing.  While 2019 novel coronavirus  (SARS-CoV-2) nucleic acids may be present in the submitted sample  additional confirmatory testing may be necessary for epidemiological  and / or clinical management purposes  to differentiate between  SARS-CoV-2 and other Sarbecovirus currently known to infect humans.  If clinically indicated additional testing with an alternate test  methodology [id number]) is advised. The SARS-CoV-2 RNA is generally  detectable in upper and lower respiratory sp ecimens during the acute  phase of infection. The expected result is Negative. Fact Sheet for Patients:  StrictlyIdeas.no Fact Sheet for Healthcare Providers: BankingDealers.co.za This test is not yet approved or cleared by the [place] FDA and has been authorized for detection and/or diagnosis of SARS-CoV-2 by FDA under an Emergency Use Authorization (EUA).  This EUA will remain in effect (meaning this test can be used) for the duration of the COVID-19 declaration under Section 564(b)(1) of the Act, 21 U.S.C. section 360bbb-3(b)(1), unless the authorization is terminated or revoked sooner. Performed at [facility], [place] [address], [place], [place] [address]     Ct Angio Head W Or Wo Contrast  Result Date: 11/20/2018 CLINICAL DATA:  68 y/o  F; posttraumatic headache. EXAM: CT ANGIOGRAPHY HEAD AND NECK TECHNIQUE: Multidetector CT imaging of the head and neck was performed using the standard protocol during bolus administration of intravenous contrast. Multiplanar CT image reconstructions and MIPs were obtained to evaluate the vascular anatomy. Carotid stenosis measurements (when applicable) are obtained utilizing NASCET  criteria, using the distal internal carotid diameter as the denominator. CONTRAST:  11mL OMNIPAQUE IOHEXOL 350 MG/ML SOLN COMPARISON:  11/19/2018 CT of the head and cervical spine. FINDINGS: CTA NECK FINDINGS Aortic arch: Aberrant right subclavian artery. Imaged portion shows no evidence of aneurysm or dissection. No significant stenosis of the major arch vessel origins. Right carotid system: No evidence of dissection, stenosis (50% or greater) or occlusion. Left carotid system: No evidence of dissection, stenosis (50% or greater) or occlusion. Vertebral arteries: Codominant. No evidence of dissection, stenosis (50% or greater) or occlusion. Skeleton: No acute fracture cervical spine. Mild cervical spondylosis with discogenic degenerative changes greatest at the C5-6 level and multilevel facet arthrosis. Other neck: Negative. Upper chest: Negative. Review of the MIP images confirms the above findings CTA HEAD FINDINGS Anterior circulation: No significant stenosis, proximal occlusion, aneurysm, or vascular malformation. Non stenotic calcific atherosclerosis of the internal carotid arteries. Posterior circulation: No significant stenosis, proximal occlusion, aneurysm, or vascular malformation. Venous sinuses: As permitted by contrast timing, patent. Anatomic variants:  None significant. Right temporal bone and skull base fracture and intracranial hemorrhage as characterized on prior CT of head. Review of the MIP images confirms the above findings IMPRESSION: No acute vascular injury identified. Patent carotid and vertebral arteries of the neck. Patent anterior and posterior intracranial circulation. No large vessel occlusion, dissection, aneurysm, or significant stenosis. Electronically Signed   By: Kristine Garbe M.D.   On: 11/20/2018 01:15   Ct Head Wo Contrast  Result Date: 11/20/2018 CLINICAL DATA:  Intracranial hemorrhage from fall EXAM: CT HEAD WITHOUT CONTRAST TECHNIQUE: Contiguous axial images  were obtained from the base of the skull through the vertex without intravenous contrast. COMPARISON:  CT head 11/19/2018 FINDINGS: Brain: Small parenchymal hemorrhage in the right temporal lobe just above the mastoid sinus unchanged in size. Mild subarachnoid hemorrhage in the right temporal lobe unchanged. Small left temporal subdural hematoma measuring 5 mm, unchanged. Mild subarachnoid hemorrhage left temporal lobe also unchanged. Ventricle size normal.  No midline shift.  No acute infarct or mass. Vascular: Negative for hyperdense vessel Skull: Fractures of the right mastoid sinus extending into the sphenoid sinus on the right. Right mastoid effusion and air-fluid level right sphenoid sinus compatible with blood. Nondepressed fracture right temporal bone extending into the right parietal bone. Sinuses/Orbits: Air-fluid level right sphenoid sinus. Right mastoid effusion. Associated fractures as above. Remainder of the sinuses clear. Negative orbit Other: None IMPRESSION: Stable CT head from yesterday Right temporal lobe hemorrhagic contusion and right temporal subarachnoid hemorrhage stable. 5 mm left temporal subdural hematoma and associated subarachnoid hemorrhage unchanged. No midline shift. Fracture through the right mastoid sinus into the sphenoid sinus. Nondepressed right temporal and parietal skull fracture. Electronically Signed   By: Franchot Gallo M.D.   On: 11/20/2018 08:46   Ct Head Wo Contrast  Result Date: 11/19/2018 CLINICAL DATA:  Head trauma, minor, GCS>=13, high clinical risk, initial exam; C-spine trauma, high clinical risk (NEXUS/CCR). Fall at home striking back of head. EXAM: CT HEAD WITHOUT CONTRAST CT CERVICAL SPINE WITHOUT CONTRAST TECHNIQUE: Multidetector CT imaging of the head and cervical spine was performed following the standard protocol without intravenous contrast. Multiplanar CT image reconstructions of the cervical spine were also generated. COMPARISON:  None. FINDINGS: CT  HEAD FINDINGS Brain: Left temporal hemorrhagic contusion measures 6 mm abutting the mastoid air cells. Additional vague intraparenchymal densities suspicious for additional contusions in the temporal occipital lobe. Multifocal subarachnoid hemorrhage in the right temporal, left temporal, and inferior frontal lobes. Foci of pneumocephalus subjacent to skull fracture. Left temporal subdural measures 4 mm, within additional 12 x 8 mm extra-axial hemorrhage in the left frontal lobe, likely additional subdural. No midline shift. No hydrocephalus. Basilar cisterns are patent. Vascular: No hyperdense vessel. Skull: Complex centrally nondisplaced right temporal bone fracture extends towards the parietal bone and involves the mastoid air cells. Small foci of subjacent pneumocephalus and soft tissue air. Mastoid air cell opacification on the right. Fracture extends to the right skull base with fluid levels in the sphenoid sinus. Fracture abuts the carotid canal. Sinuses/Orbits: Fluid levels in the right sphenoid sinus. Scattered opacification of ethmoid air cells. Other: Right parietal. CT CERVICAL SPINE FINDINGS Alignment: Trace retrolisthesis of C5 on C6 is likely degenerative. No traumatic subluxation. Skull base and vertebrae: No acute fracture. Vertebral body heights are maintained. The dens and skull base are intact. Soft tissues and spinal canal: No prevertebral fluid or swelling. No visible canal hematoma. Disc levels: Disc space narrowing and endplate spurring most prominent at C5-C6. Multilevel facet  arthropathy. Upper chest: Emphysema. No acute findings. Other: None. IMPRESSION: 1. Complex right temporal bone fracture involving the mastoid air cells tracking to the skull base. Fracture extends to the region of the carotid canals, recommend CTA to exclude vascular injury. Foci of pneumocephalus subjacent to temporal fracture. 2. Small intraparenchymal hemorrhage in the right temporal lobe, additional probable  temporal contusions. 3. Multifocal scattered subarachnoid hemorrhage both right and left temporal as well as left frontal. 4. Left temporal subdural measuring 4 mm. Additional extra-axial hemorrhage in the left frontal region measures 8 mm in thickness, favoring focal subdural over epidural. 5. No fracture or subluxation of the cervical spine. Critical Value/emergent results were called by telephone at the time of interpretation on 11/19/2018 at 11:52 pm to [place] , who verbally acknowledged these results. Electronically Signed   By: Keith Rake M.D.   On: 11/19/2018 23:57   Ct Angio Neck W And/or Wo Contrast  Result Date: 11/20/2018 CLINICAL DATA:  68 y/o  F; posttraumatic headache. EXAM: CT ANGIOGRAPHY HEAD AND NECK TECHNIQUE: Multidetector CT imaging of the head and neck was performed using the standard protocol during bolus administration of intravenous contrast. Multiplanar CT image reconstructions and MIPs were obtained to evaluate the vascular anatomy. Carotid stenosis measurements (when applicable) are obtained utilizing NASCET criteria, using the distal internal carotid diameter as the denominator. CONTRAST:  149mL OMNIPAQUE IOHEXOL 350 MG/ML SOLN COMPARISON:  11/19/2018 CT of the head and cervical spine. FINDINGS: CTA NECK FINDINGS Aortic arch: Aberrant right subclavian artery. Imaged portion shows no evidence of aneurysm or dissection. No significant stenosis of the major arch vessel origins. Right carotid system: No evidence of dissection, stenosis (50% or greater) or occlusion. Left carotid system: No evidence of dissection, stenosis (50% or greater) or occlusion. Vertebral arteries: Codominant. No evidence of dissection, stenosis (50% or greater) or occlusion. Skeleton: No acute fracture cervical spine. Mild cervical spondylosis with discogenic degenerative changes greatest at the C5-6 level and multilevel facet arthrosis. Other neck: Negative. Upper chest: Negative. Review of the MIP images  confirms the above findings CTA HEAD FINDINGS Anterior circulation: No significant stenosis, proximal occlusion, aneurysm, or vascular malformation. Non stenotic calcific atherosclerosis of the internal carotid arteries. Posterior circulation: No significant stenosis, proximal occlusion, aneurysm, or vascular malformation. Venous sinuses: As permitted by contrast timing, patent. Anatomic variants: None significant. Right temporal bone and skull base fracture and intracranial hemorrhage as characterized on prior CT of head. Review of the MIP images confirms the above findings IMPRESSION: No acute vascular injury identified. Patent carotid and vertebral arteries of the neck. Patent anterior and posterior intracranial circulation. No large vessel occlusion, dissection, aneurysm, or significant stenosis. Electronically Signed   By: Kristine Garbe M.D.   On: 11/20/2018 01:15   Ct Cervical Spine Wo Contrast  Result Date: 11/19/2018 CLINICAL DATA:  Head trauma, minor, GCS>=13, high clinical risk, initial exam; C-spine trauma, high clinical risk (NEXUS/CCR). Fall at home striking back of head. EXAM: CT HEAD WITHOUT CONTRAST CT CERVICAL SPINE WITHOUT CONTRAST TECHNIQUE: Multidetector CT imaging of the head and cervical spine was performed following the standard protocol without intravenous contrast. Multiplanar CT image reconstructions of the cervical spine were also generated. COMPARISON:  None. FINDINGS: CT HEAD FINDINGS Brain: Left temporal hemorrhagic contusion measures 6 mm abutting the mastoid air cells. Additional vague intraparenchymal densities suspicious for additional contusions in the temporal occipital lobe. Multifocal subarachnoid hemorrhage in the right temporal, left temporal, and inferior frontal lobes. Foci of pneumocephalus subjacent to skull fracture. Left  temporal subdural measures 4 mm, within additional 12 x 8 mm extra-axial hemorrhage in the left frontal lobe, likely additional subdural.  No midline shift. No hydrocephalus. Basilar cisterns are patent. Vascular: No hyperdense vessel. Skull: Complex centrally nondisplaced right temporal bone fracture extends towards the parietal bone and involves the mastoid air cells. Small foci of subjacent pneumocephalus and soft tissue air. Mastoid air cell opacification on the right. Fracture extends to the right skull base with fluid levels in the sphenoid sinus. Fracture abuts the carotid canal. Sinuses/Orbits: Fluid levels in the right sphenoid sinus. Scattered opacification of ethmoid air cells. Other: Right parietal. CT CERVICAL SPINE FINDINGS Alignment: Trace retrolisthesis of C5 on C6 is likely degenerative. No traumatic subluxation. Skull base and vertebrae: No acute fracture. Vertebral body heights are maintained. The dens and skull base are intact. Soft tissues and spinal canal: No prevertebral fluid or swelling. No visible canal hematoma. Disc levels: Disc space narrowing and endplate spurring most prominent at C5-C6. Multilevel facet arthropathy. Upper chest: Emphysema. No acute findings. Other: None. IMPRESSION: 1. Complex right temporal bone fracture involving the mastoid air cells tracking to the skull base. Fracture extends to the region of the carotid canals, recommend CTA to exclude vascular injury. Foci of pneumocephalus subjacent to temporal fracture. 2. Small intraparenchymal hemorrhage in the right temporal lobe, additional probable temporal contusions. 3. Multifocal scattered subarachnoid hemorrhage both right and left temporal as well as left frontal. 4. Left temporal subdural measuring 4 mm. Additional extra-axial hemorrhage in the left frontal region measures 8 mm in thickness, favoring focal subdural over epidural. 5. No fracture or subluxation of the cervical spine. Critical Value/emergent results were called by telephone at the time of interpretation on 11/19/2018 at 11:52 pm to [place] , who verbally acknowledged these results.  Electronically Signed   By: Keith Rake M.D.   On: 11/19/2018 23:57    Pertinent items noted in HPI and remainder of comprehensive ROS otherwise negative. Blood pressure 131/74, pulse 87, temperature 98.2 F (36.8 C), temperature source Oral, resp. rate 20, height 5\' 2"  (1.575 m), weight (!) 139 kg, SpO2 99 %. Patient is awake and alert.  She is oriented and appropriate.  Her speech is fluent.  Her judgment and insight appear intact.  Cranial nerve function normal bilaterally except she has some decreased hearing in her right ear.  Facial movement and sensation is normal bilaterally in her face.  Motor examination of her extremities reveals 5/5 strength in both upper and lower extremities.  No evidence of pronator drift.  Examination head ears nose and throat demonstrates some dried blood in her right ear.  No evidence of CSF leak.  Neck nontender.  Chest and abdomen benign.  Assessment/Plan: Status post fall with resultant traumatic brain injury.  Follow-up head CT scan today demonstrates no evidence of progression of her small amount of subcortical contusion.  No evidence of extra-axial bleeding.  No evidence of significant edema or mass-effect.  Patient with a right temporal bone fracture.  No evidence of ongoing CSF leak.  No indication for intervention for this.  Patient may be mobilized.  Once she is stable and walking well she may progress to discharge.  She does not require any further imaging.  Follow-up with me in 2 weeks.  Mallie Mussel A Nissim Fleischer 11/20/2018, 10:15 AM

## 2018-11-20 NOTE — ED Notes (Addendum)
Blood coming from the pts rt ear

## 2018-11-20 NOTE — Plan of Care (Signed)
68 year old female admitted status post fall upon review of the chart it is found she has very increased alcohol level on admission her level is 182.  Her husband does report that they normally drink very less he is not sure how her level was that high.  She was trying to go up the steps and slipped and fell and hit her head.  Repeat CT scan today shows right temporal lobe hemorrhagic contusion with right temporal subarachnoid hemorrhage which is stable.  5 mm left temporal subdural hematoma associated subarachnoid hemorrhage unchanged there is no midline shift.  Fracture through the right mastoid sinus into the sphenoid sinus nondepressed right temporal and parietal skull fracture.  Dr. [name] has seen the patient does not need any surgical intervention at this time.  Follow-up with him in 2 weeks.  ENT has been called from the ED await their input.  Patient is very unstable on her feet and dizzy on standing up.  Will obtain physical therapy consult and monitor her closely today.  She is at very high risk for recurrent falls.  I will also place her on CIWA protocol.  CBC and BMP today looks stable potassium back to normal at 4.5.

## 2018-11-20 NOTE — Evaluation (Signed)
Physical Therapy Evaluation Patient Details Name: Sharon Fox MRN: [id number] DOB: [birth year] Today's Date: 11/20/2018   History of Present Illness  Patient was walking up the stairs with 2 glasses of water when she thinks she tripped. She suffered a contusion of the back of her head. Her Head CT showed a small right temproal hemmorhage and a fracture of the right temporal bone. She has no prior histroy of falling. She also is having some dismofort in her right collar bone and her left lower back. PMH: No significant other PMH  Clinical Impression  Patient reported mild syncope with transfers. She did not have to use an assistive device but has a walker at home. While ambulating she became acutely nauseated and requested to go back to her room and lie down. She feels like it may be because she hasn't eaten and has taken vitamins. Nursing advised it may be worth ambulating with the patient again to make sure the nausea wasn't related to her current problems. She had an increase in low back pain when ambulating. Acute therapy will continue to work with patient for general mobility and stair training. She will likely not need therapy after discharge unless her lower back keeps hurting.     Follow Up Recommendations No PT follow up    Equipment Recommendations  None recommended by PT    Recommendations for Other Services       Precautions / Restrictions Precautions Precautions: Fall Precaution Comments: has had some syncope getting up tto the bathroom Restrictions Weight Bearing Restrictions: No      Mobility  Bed Mobility Overal bed mobility: Independent             General bed mobility comments: sat edge of the bed without difficulty. Did report mild syncope   Transfers Overall transfer level: Needs assistance Equipment used: Rolling walker (2 wheeled);None Transfers: Sit to/from Stand Sit to Stand: Min guard         General transfer comment: min gaurd for inital  balance   Ambulation/Gait Ambulation/Gait assistance: Min guard Gait Distance (Feet): 100 Feet Assistive device: None Gait Pattern/deviations: WFL(Within Functional Limits) Gait velocity: decreased Gait velocity interpretation: <1.31 ft/sec, indicative of household ambulator General Gait Details: mild drift from side to side with gait. After about 3' patient had a significant increase  in nausea. She requested to go back to the room  Stairs            Wheelchair Mobility    Modified Rankin (Stroke Patients Only) Modified Rankin (Stroke Patients Only) Pre-Morbid Rankin Score: No symptoms Modified Rankin: Slight disability     Balance Overall balance assessment: Needs assistance Sitting-balance support: No upper extremity supported;Bilateral upper extremity supported Sitting balance-Leahy Scale: Good Sitting balance - Comments: reported syncope but no LOB    Standing balance support: No upper extremity supported Standing balance-Leahy Scale: Fair Standing balance comment: gaurding for balance                High Level Balance Comments: unable to test 2nd to an increase in nausea              Pertinent Vitals/Pain Pain Assessment: Faces Faces Pain Scale: Hurts little more Pain Location: nausea  Pain Descriptors / Indicators: Grimacing Pain Intervention(s): Monitored during session;Limited activity within patient's tolerance;Repositioned    Home Living Family/patient expects to be discharged to:: Private residence Living Arrangements: Spouse/significant other Available Help at Discharge: Family Type of Home: House Home Access: Stairs to enter  Entrance Stairs-Number of Steps: 5 Home Layout: Two level Home Equipment: Walker - 2 wheels      Prior Function Level of Independence: Independent         Comments: had no mobility issues prior to her fall      Hand Dominance   Dominant Hand: Right    Extremity/Trunk Assessment   Upper Extremity  Assessment Upper Extremity Assessment: Overall WFL for tasks assessed    Lower Extremity Assessment Lower Extremity Assessment: Overall WFL for tasks assessed    Cervical / Trunk Assessment Cervical / Trunk Assessment: Normal  Communication   Communication: No difficulties  Cognition Arousal/Alertness: Awake/alert Behavior During Therapy: WFL for tasks assessed/performed Overall Cognitive Status: Within Functional Limits for tasks assessed                                        General Comments General comments (skin integrity, edema, etc.): patient reported bleeding out of her ear but this has been happening      Exercises     Assessment/Plan    PT Assessment Patient needs continued PT services  PT Problem List Decreased activity tolerance;Decreased balance;Decreased mobility;Pain       PT Treatment Interventions DME instruction;Gait training;Functional mobility training;Therapeutic activities;Therapeutic exercise;Neuromuscular re-education;Patient/family education    PT Goals (Current goals can be found in the Care Plan section)  Acute Rehab PT Goals Patient Stated Goal: to fell less dizzy walking  PT Goal Formulation: With patient Time For Goal Achievement: 11/27/18 Potential to Achieve Goals: Good    Frequency Min 3X/week   Barriers to discharge        Co-evaluation               AM-PAC PT "6 Clicks" Mobility  Outcome Measure Help needed turning from your back to your side while in a flat bed without using bedrails?: None Help needed moving from lying on your back to sitting on the side of a flat bed without using bedrails?: None Help needed moving to and from a bed to a chair (including a wheelchair)?: A Little Help needed standing up from a chair using your arms (e.g., wheelchair or bedside chair)?: A Little Help needed to walk in hospital room?: A Little Help needed climbing 3-5 steps with a railing? : A Little 6 Click Score: 20     End of Session Equipment Utilized During Treatment: Back brace Activity Tolerance: Other (comment)(limited by nausea ) Patient left: in bed;with call bell/phone within reach;with bed alarm set Nurse Communication: Mobility status PT Visit Diagnosis: Other abnormalities of gait and mobility (R26.89);Pain Pain - part of body: (low back )    Time: 4268-3419 PT Time Calculation (min) (ACUTE ONLY): 17 min   Charges:   PT Evaluation $PT Eval Moderate Complexity: 1 Mod            Carney Living PT DPT  11/20/2018, 1:46 PM

## 2018-11-20 NOTE — ED Notes (Signed)
Pt back to c-t for further studies

## 2018-11-20 NOTE — ED Notes (Signed)
Unsuccessful attempt to call report  rn hanging blood call back in 15 minutes

## 2018-11-20 NOTE — H&P (Signed)
History and Physical   Sharon Fox QVZ:[id number] DOB: Oct 21, 1950 DOA: 11/19/2018  Referring MD/NP/PA: Dr. Tyrone Nine  PCP: [name], MD   Outpatient Specialists: None  Patient coming from: Home  Chief Complaint: Fall  HPI: Sharon Fox is a 68 y.o. female with medical history significant of no significant past medical history who was at home with her husband had a few glasses of wine together.  She was going upstairs get 2 cups of water for them when she slipped and fell.  Husband was in a different room when he heard a loud noise and found patient on the floor.  She had no recollection of events afterwards until she woke up in the hospital.  Patient had hospital laceration on her skull and subsequent scans showed scattered intracranial hemorrhages as well as right temporal lobe fracture.  Neurosurgery and ENT have been contacted.  Neurosurgery will consult in the hospital while ENT recommends outpatient follow-up.  Patient is being admitted to the hospital for management of these intracranial hemorrhages and possible cause of her fall.  She is awake now with some headaches.  Denied any seizure disorder.  Denied any prodrome.  Patient also denied any prior falls.  Her gait has been fine..  ED Course: Temperature 97.7 blood pressure 99/52 with a pulse 96 respiratory rate of 22 oxygen sat 93% on room air.  CBC entirely within normal.  Chemistry showed a potassium of 2.8 and calcium 8.7.  Creatinine is normal at 0.51.  Alcohol level 182, CT neck and head without contrast showed complex right temporal bone fracture as well as small intraparenchymal hemorrhage in the right temporal lobe with multifocal scattered subarachnoid hemorrhage both right and left temporal as well as left frontal.  There was also a left temporal subdural measuring 4 mm with additional ones in the left frontal region about 8 mm in thickness.  Case discussed with Dr. Deri Fuelling of neurosurgery as well as Dr. [name]  who both recommend admission.  Patient is being admitted to the medical service.  Review of Systems: As per HPI otherwise 10 point review of systems negative.    History reviewed. No pertinent past medical history.  History reviewed. No pertinent surgical history.   reports that she has never smoked. She has never used smokeless tobacco. She reports current alcohol use. No history on file for drug.  Allergies  Allergen Reactions   Ciprofloxacin Rash    No family history on file.   Prior to Admission medications   Medication Sig Start Date End Date Taking? Authorizing Provider  cholecalciferol (VITAMIN D3) 25 MCG (1000 UT) tablet Take 1,000 Units by mouth daily.   Yes [provider]  ESTRING 2 MG vaginal ring Place 1 application vaginally every 3 (three) months. 09/13/18  Yes [provider]  hyoscyamine (ANASPAZ) 0.125 MG TBDP disintergrating tablet Place 1 tablet under the tongue 2 (two) times daily as needed for spasms. 11/16/18  Yes [provider]  Multiple Vitamin (MULTIVITAMIN WITH MINERALS) TABS tablet Take 1 tablet by mouth daily.   Yes [provider]  Probiotic Product (ALIGN PO) Take 1 tablet by mouth daily.   Yes [provider]  zolpidem (AMBIEN) 5 MG tablet Take 2.5 mg by mouth at bedtime. 10/28/18  Yes [provider]    Physical Exam: Vitals:   11/20/18 0145 11/20/18 0200 11/20/18 0215 11/20/18 0230  BP: (!) 147/77 136/78 137/69 132/72  Pulse: 94 96 94 95  Resp: (!) 22 17 19  20  Temp:      SpO2: 98% 100% 97% 97%  Weight:      Height:          Constitutional: NAD, acutely ill looking Vitals:   11/20/18 0145 11/20/18 0200 11/20/18 0215 11/20/18 0230  BP: (!) 147/77 136/78 137/69 132/72  Pulse: 94 96 94 95  Resp: (!) 22 17 19 20   Temp:      SpO2: 98% 100% 97% 97%  Weight:      Height:       Eyes: PERRL, lids and conjunctivae normal ENMT: Mucous membranes are moist. Posterior pharynx clear of any  exudate or lesions.Normal dentition.  Neck: normal, supple, no masses, no thyromegaly Respiratory: clear to auscultation bilaterally, no wheezing, no crackles. Normal respiratory effort. No accessory muscle use.  Cardiovascular: Regular rate and rhythm, no murmurs / rubs / gallops. No extremity edema. 2+ pedal pulses. No carotid bruits.  Abdomen: no tenderness, no masses palpated. No hepatosplenomegaly. Bowel sounds positive.  Musculoskeletal: no clubbing / cyanosis. No joint deformity upper and lower extremities. Good ROM, no contractures. Normal muscle tone.  Skin: no rashes, lesions, ulcers. No induration occipital skin laceration with induration Neurologic: CN 2-12 grossly intact. Sensation intact, DTR normal. Strength 5/5 in all 4.  No significant neurologic deficits Psychiatric: Normal judgment and insight. Alert and oriented x 3. Normal mood.     Labs on Admission: I have personally reviewed following labs and imaging studies  CBC: Recent Labs  Lab 11/19/18 2255  WBC 8.6  HGB 13.7  HCT 41.5  MCV 91.2  PLT 119   Basic Metabolic Panel: Recent Labs  Lab 11/19/18 2255  NA 141  K 2.8*  CL 105  CO2 26  GLUCOSE 116*  BUN 14  CREATININE 0.51  CALCIUM 8.7*   GFR: Estimated Creatinine Clearance: 91.1 mL/min (by C-G formula based on SCr of 0.51 mg/dL). Liver Function Tests: No results for input(s): AST, ALT, ALKPHOS, BILITOT, PROT, ALBUMIN in the last 168 hours. No results for input(s): LIPASE, AMYLASE in the last 168 hours. No results for input(s): AMMONIA in the last 168 hours. Coagulation Profile: No results for input(s): INR, PROTIME in the last 168 hours. Cardiac Enzymes: No results for input(s): CKTOTAL, CKMB, CKMBINDEX, TROPONINI in the last 168 hours. BNP (last 3 results) No results for input(s): PROBNP in the last 8760 hours. HbA1C: No results for input(s): HGBA1C in the last 72 hours. CBG: No results for input(s): GLUCAP in the last 168 hours. Lipid  Profile: No results for input(s): CHOL, HDL, LDLCALC, TRIG, CHOLHDL, LDLDIRECT in the last 72 hours. Thyroid Function Tests: No results for input(s): TSH, T4TOTAL, FREET4, T3FREE, THYROIDAB in the last 72 hours. Anemia Panel: No results for input(s): VITAMINB12, FOLATE, FERRITIN, TIBC, IRON, RETICCTPCT in the last 72 hours. Urine analysis: No results found for: COLORURINE, APPEARANCEUR, LABSPEC, PHURINE, GLUCOSEU, HGBUR, BILIRUBINUR, KETONESUR, PROTEINUR, UROBILINOGEN, NITRITE, LEUKOCYTESUR Sepsis Labs: @LABRCNTIP (procalcitonin:4,lacticidven:4) ) Recent Results (from the past 240 hour(s))  SARS Coronavirus 2 (CEPHEID- Performed in [place] hospital lab), Hosp Order     Status: None   Collection Time: 11/20/18  1:40 AM   Specimen: Nasopharyngeal Swab  Result Value Ref Range Status   SARS Coronavirus 2 NEGATIVE NEGATIVE Final    Comment: (NOTE) If result is NEGATIVE SARS-CoV-2 target nucleic acids are NOT DETECTED. The SARS-CoV-2 RNA is generally detectable in upper and lower  respiratory specimens during the acute phase of infection. The lowest  concentration of SARS-CoV-2 viral copies this assay  can detect is 250  copies / mL. A negative result does not preclude SARS-CoV-2 infection  and should not be used as the sole basis for treatment or other  patient management decisions.  A negative result may occur with  improper specimen collection / handling, submission of specimen other  than nasopharyngeal swab, presence of viral mutation(s) within the  areas targeted by this assay, and inadequate number of viral copies  (<250 copies / mL). A negative result must be combined with clinical  observations, patient history, and epidemiological information. If result is POSITIVE SARS-CoV-2 target nucleic acids are DETECTED. The SARS-CoV-2 RNA is generally detectable in upper and lower  respiratory specimens dur ing the acute phase of infection.  Positive  results are indicative of active  infection with SARS-CoV-2.  Clinical  correlation with patient history and other diagnostic information is  necessary to determine patient infection status.  Positive results do  not rule out bacterial infection or co-infection with other viruses. If result is PRESUMPTIVE POSTIVE SARS-CoV-2 nucleic acids MAY BE PRESENT.   A presumptive positive result was obtained on the submitted specimen  and confirmed on repeat testing.  While 2019 novel coronavirus  (SARS-CoV-2) nucleic acids may be present in the submitted sample  additional confirmatory testing may be necessary for epidemiological  and / or clinical management purposes  to differentiate between  SARS-CoV-2 and other Sarbecovirus currently known to infect humans.  If clinically indicated additional testing with an alternate test  methodology [phone]) is advised. The SARS-CoV-2 RNA is generally  detectable in upper and lower respiratory sp ecimens during the acute  phase of infection. The expected result is Negative. Fact Sheet for Patients:  StrictlyIdeas.no Fact Sheet for Healthcare Providers: BankingDealers.co.za This test is not yet approved or cleared by the [place] FDA and has been authorized for detection and/or diagnosis of SARS-CoV-2 by FDA under an Emergency Use Authorization (EUA).  This EUA will remain in effect (meaning this test can be used) for the duration of the COVID-19 declaration under Section 564(b)(1) of the Act, 21 U.S.C. section 360bbb-3(b)(1), unless the authorization is terminated or revoked sooner. Performed at [facility], [place] [address] [place], [place] [address]      Radiological Exams on Admission: Ct Head Wo Contrast  Result Date: 11/19/2018 CLINICAL DATA:  Head trauma, minor, GCS>=13, high clinical risk, initial exam; C-spine trauma, high clinical risk (NEXUS/CCR). Fall at home striking back of head. EXAM: CT HEAD WITHOUT CONTRAST CT  CERVICAL SPINE WITHOUT CONTRAST TECHNIQUE: Multidetector CT imaging of the head and cervical spine was performed following the standard protocol without intravenous contrast. Multiplanar CT image reconstructions of the cervical spine were also generated. COMPARISON:  None. FINDINGS: CT HEAD FINDINGS Brain: Left temporal hemorrhagic contusion measures 6 mm abutting the mastoid air cells. Additional vague intraparenchymal densities suspicious for additional contusions in the temporal occipital lobe. Multifocal subarachnoid hemorrhage in the right temporal, left temporal, and inferior frontal lobes. Foci of pneumocephalus subjacent to skull fracture. Left temporal subdural measures 4 mm, within additional 12 x 8 mm extra-axial hemorrhage in the left frontal lobe, likely additional subdural. No midline shift. No hydrocephalus. Basilar cisterns are patent. Vascular: No hyperdense vessel. Skull: Complex centrally nondisplaced right temporal bone fracture extends towards the parietal bone and involves the mastoid air cells. Small foci of subjacent pneumocephalus and soft tissue air. Mastoid air cell opacification on the right. Fracture extends to the right skull base with fluid levels in the sphenoid sinus. Fracture  abuts the carotid canal. Sinuses/Orbits: Fluid levels in the right sphenoid sinus. Scattered opacification of ethmoid air cells. Other: Right parietal. CT CERVICAL SPINE FINDINGS Alignment: Trace retrolisthesis of C5 on C6 is likely degenerative. No traumatic subluxation. Skull base and vertebrae: No acute fracture. Vertebral body heights are maintained. The dens and skull base are intact. Soft tissues and spinal canal: No prevertebral fluid or swelling. No visible canal hematoma. Disc levels: Disc space narrowing and endplate spurring most prominent at C5-C6. Multilevel facet arthropathy. Upper chest: Emphysema. No acute findings. Other: None. IMPRESSION: 1. Complex right temporal bone fracture involving the  mastoid air cells tracking to the skull base. Fracture extends to the region of the carotid canals, recommend CTA to exclude vascular injury. Foci of pneumocephalus subjacent to temporal fracture. 2. Small intraparenchymal hemorrhage in the right temporal lobe, additional probable temporal contusions. 3. Multifocal scattered subarachnoid hemorrhage both right and left temporal as well as left frontal. 4. Left temporal subdural measuring 4 mm. Additional extra-axial hemorrhage in the left frontal region measures 8 mm in thickness, favoring focal subdural over epidural. 5. No fracture or subluxation of the cervical spine. Critical Value/emergent results were called by telephone at the time of interpretation on 11/19/2018 at 11:52 pm to [place] , who verbally acknowledged these results. Electronically Signed   By: Keith Rake M.D.   On: 11/19/2018 23:57   Ct Cervical Spine Wo Contrast  Result Date: 11/19/2018 CLINICAL DATA:  Head trauma, minor, GCS>=13, high clinical risk, initial exam; C-spine trauma, high clinical risk (NEXUS/CCR). Fall at home striking back of head. EXAM: CT HEAD WITHOUT CONTRAST CT CERVICAL SPINE WITHOUT CONTRAST TECHNIQUE: Multidetector CT imaging of the head and cervical spine was performed following the standard protocol without intravenous contrast. Multiplanar CT image reconstructions of the cervical spine were also generated. COMPARISON:  None. FINDINGS: CT HEAD FINDINGS Brain: Left temporal hemorrhagic contusion measures 6 mm abutting the mastoid air cells. Additional vague intraparenchymal densities suspicious for additional contusions in the temporal occipital lobe. Multifocal subarachnoid hemorrhage in the right temporal, left temporal, and inferior frontal lobes. Foci of pneumocephalus subjacent to skull fracture. Left temporal subdural measures 4 mm, within additional 12 x 8 mm extra-axial hemorrhage in the left frontal lobe, likely additional subdural. No midline shift. No  hydrocephalus. Basilar cisterns are patent. Vascular: No hyperdense vessel. Skull: Complex centrally nondisplaced right temporal bone fracture extends towards the parietal bone and involves the mastoid air cells. Small foci of subjacent pneumocephalus and soft tissue air. Mastoid air cell opacification on the right. Fracture extends to the right skull base with fluid levels in the sphenoid sinus. Fracture abuts the carotid canal. Sinuses/Orbits: Fluid levels in the right sphenoid sinus. Scattered opacification of ethmoid air cells. Other: Right parietal. CT CERVICAL SPINE FINDINGS Alignment: Trace retrolisthesis of C5 on C6 is likely degenerative. No traumatic subluxation. Skull base and vertebrae: No acute fracture. Vertebral body heights are maintained. The dens and skull base are intact. Soft tissues and spinal canal: No prevertebral fluid or swelling. No visible canal hematoma. Disc levels: Disc space narrowing and endplate spurring most prominent at C5-C6. Multilevel facet arthropathy. Upper chest: Emphysema. No acute findings. Other: None. IMPRESSION: 1. Complex right temporal bone fracture involving the mastoid air cells tracking to the skull base. Fracture extends to the region of the carotid canals, recommend CTA to exclude vascular injury. Foci of pneumocephalus subjacent to temporal fracture. 2. Small intraparenchymal hemorrhage in the right temporal lobe, additional probable temporal contusions. 3. Multifocal scattered  subarachnoid hemorrhage both right and left temporal as well as left frontal. 4. Left temporal subdural measuring 4 mm. Additional extra-axial hemorrhage in the left frontal region measures 8 mm in thickness, favoring focal subdural over epidural. 5. No fracture or subluxation of the cervical spine. Critical Value/emergent results were called by telephone at the time of interpretation on 11/19/2018 at 11:52 pm to [place] , who verbally acknowledged these results. Electronically Signed    By: Keith Rake M.D.   On: 11/19/2018 23:57    EKG: Independently reviewed.  Shows normal sinus rhythm with a rate of 76.  Normal intervals.  No significant ST changes.  Assessment/Plan Principal Problem:   Intracranial bleed (HCC) Active Problems:   Hypokalemia   Alcohol abuse with intoxication ([place])   Temporal bone fracture (HCC)     #1 multiple intracranial hemorrhages: Traumatic in nature.  Patient is fully awake and communicating.  She will be admitted and closely observe in progressive care unit.  Neurosurgery to follow.  Repeat head CT most likely today.  Follow neurosurgical recommendations.  #2 right temporal bone fractures: Self-limiting.  No surgical intervention anticipated at the moment.  Patient to have follow-up with ENT at discharge.  #3 possible alcohol intoxication: Patient says she is a social drinker.  Just had a few glasses of wine.  Monitor in the hospital.  No signs of DT at this point.  #4 hypokalemia: Potassium is noted to be 2.8.  No obvious cause.  Replete potassium.  #5 fall: Appears to be mechanical in nature.  Physical therapy evaluation prior to discharge.  DVT prophylaxis: SCD Code Status: Full code Family Communication: Husband over the phone Disposition Plan: To be determined Consults called: Dr. Mallie Mussel pool, Neurosurgery and Dr. [name] ENT Admission status: Inpatient  Severity of Illness: The appropriate patient status for this patient is INPATIENT. Inpatient status is judged to be reasonable and necessary in order to provide the required intensity of service to ensure the patient's safety. The patient's presenting symptoms, physical exam findings, and initial radiographic and laboratory data in the context of their chronic comorbidities is felt to place them at high risk for further clinical deterioration. Furthermore, it is not anticipated that the patient will be medically stable for discharge from the hospital within 2 midnights of  admission. The following factors support the patient status of inpatient.   " The patient's presenting symptoms include fall with laceration of the skull. " The worrisome physical exam findings include evidence of occipital laceration. " The initial radiographic and laboratory data are worrisome because of multiple intracranial hemorrhages. " The chronic co-morbidities include none.   * I certify that at the point of admission it is my clinical judgment that the patient will require inpatient hospital care spanning beyond 2 midnights from the point of admission due to high intensity of service, high risk for further deterioration and high frequency of surveillance required.Barbette Merino MD [facility] Pager [phone]  If 7PM-7AM, please contact night-coverage [email or web address] Password [email or web address]  11/20/2018, 3:08 AM

## 2018-11-20 NOTE — ED Notes (Signed)
Dr Tyrone Nine repaired a 1 ' LACERATION TO THE BACK OF HER HEAD  Dr Tyrone Nine is calling her husband to update her husband

## 2018-11-20 NOTE — ED Notes (Signed)
Pt rfemains alert and oriented  Sl headache now  Both pupils equal and reactive 3.0 bi-laterally

## 2018-11-20 NOTE — ED Notes (Signed)
Alert asking questions repeatedly hard of hearing.  More co-operative at present

## 2018-11-20 NOTE — Progress Notes (Signed)
Patient s/p mechanical fall.  Small amot of scattered traumatic SAH. Pt also with temporal bone fx.  No indication for neurosurgical intervention.  Plan for formal consult in am

## 2018-11-20 NOTE — ED Notes (Signed)
Pt alert c/o a headache still  Her rt ear continues to bleed slowly

## 2018-11-20 NOTE — Progress Notes (Signed)
Received the Pt from the ED, alert and oriented, placed on Tele and verified, call light in reach, personal items within reach, implemented orders as per MD

## 2018-11-20 NOTE — ED Notes (Signed)
ED TO INPATIENT HANDOFF REPORT  ED Nurse Name and Phone #:  Patty [phone]  S Name/Age/Gender Sharon Fox 68 y.o. female Room/Bed: RESUSC/RESUSC  Code Status   Code Status: Not on file  Home/SNF/Other Home Patient oriented to: self, place, time, and event Is this baseline? Yes   Triage Complete: Triage complete  Chief Complaint Level 2 - Fall/GCS 14  Triage Note The pt was brought in by gems from home  She had fallen at home striking her head the back of her head.  She had been drinking heavily  The husband found her in the floor unconscious.  When ems arrived pt talking but asking questions repeatedly,  On arrival both pupils equal and react  approx size 3.0  Equal and react  The pt has a laceration to the back of her head bleeding controlled at present   Allergies Allergies  Allergen Reactions  . Ciprofloxacin Rash    Level of Care/Admitting Diagnosis ED Disposition    ED Disposition Condition [facility]: [place] [100100]  Level of Care: Progressive [102]  Covid Evaluation: N/A  Diagnosis: Intracranial bleed ([place]) [161096]  Admitting Physician: Elwyn Reach [[id number]]  Attending Physician: Elwyn Reach [[id number]]  Estimated length of stay: past midnight tomorrow  Certification:: I certify this patient will need inpatient services for at least 2 midnights  PT Class (Do Not Modify): Inpatient [101]  PT Acc Code (Do Not Modify): Private [1]       B Medical/Surgery History History reviewed. No pertinent past medical history. History reviewed. No pertinent surgical history.   A IV Location/Drains/Wounds Patient Lines/Drains/Airways Status   Active Line/Drains/Airways    Name:   Placement date:   Placement time:   Site:   Days:   Peripheral IV 11/19/18 Left Antecubital   11/19/18    2245    Antecubital   1          Intake/Output Last 24 hours No intake or output data in the 24 hours ending 11/20/18  0221  Labs/Imaging Results for orders placed or performed during the hospital encounter of 11/19/18 (from the past 48 hour(s))  CBC     Status: None   Collection Time: 11/19/18 10:55 PM  Result Value Ref Range   WBC 8.6 4.0 - 10.5 K/uL   RBC 4.55 3.87 - 5.11 MIL/uL   Hemoglobin 13.7 12.0 - 15.0 g/dL   HCT 41.5 36.0 - 46.0 %   MCV 91.2 80.0 - 100.0 fL   MCH 30.1 26.0 - 34.0 pg   MCHC 33.0 30.0 - 36.0 g/dL   RDW 14.2 11.5 - 15.5 %   Platelets 294 150 - 400 K/uL   nRBC 0.0 0.0 - 0.2 %    Comment: Performed at [facility], [place] [address], [place], [place] [address]  Basic metabolic panel     Status: Abnormal   Collection Time: 11/19/18 10:55 PM  Result Value Ref Range   Sodium 141 135 - 145 mmol/L   Potassium 2.8 (L) 3.5 - 5.1 mmol/L   Chloride 105 98 - 111 mmol/L   CO2 26 22 - 32 mmol/L   Glucose, Bld 116 (H) 70 - 99 mg/dL   BUN 14 8 - 23 mg/dL   Creatinine, Ser 0.51 0.44 - 1.00 mg/dL   Calcium 8.7 (L) 8.9 - 10.3 mg/dL   GFR calc non Af Amer >60 >60 mL/min   GFR calc Af Amer >60 >60  mL/min   Anion gap 10 5 - 15    Comment: Performed at [facility] [address] [place], [place] [address]  Ethanol     Status: Abnormal   Collection Time: 11/19/18 10:55 PM  Result Value Ref Range   Alcohol, Ethyl (B) 182 (H) <10 mg/dL    Comment: (NOTE) Lowest detectable limit for serum alcohol is 10 mg/dL. For medical purposes only. Performed at [facility], [place] [address], [place], [place] [address]    Ct Head Wo Contrast  Result Date: 11/19/2018 CLINICAL DATA:  Head trauma, minor, GCS>=13, high clinical risk, initial exam; C-spine trauma, high clinical risk (NEXUS/CCR). Fall at home striking back of head. EXAM: CT HEAD WITHOUT CONTRAST CT CERVICAL SPINE WITHOUT CONTRAST TECHNIQUE: Multidetector CT imaging of the head and cervical spine was performed following the standard protocol without intravenous contrast. Multiplanar CT image reconstructions of the cervical  spine were also generated. COMPARISON:  None. FINDINGS: CT HEAD FINDINGS Brain: Left temporal hemorrhagic contusion measures 6 mm abutting the mastoid air cells. Additional vague intraparenchymal densities suspicious for additional contusions in the temporal occipital lobe. Multifocal subarachnoid hemorrhage in the right temporal, left temporal, and inferior frontal lobes. Foci of pneumocephalus subjacent to skull fracture. Left temporal subdural measures 4 mm, within additional 12 x 8 mm extra-axial hemorrhage in the left frontal lobe, likely additional subdural. No midline shift. No hydrocephalus. Basilar cisterns are patent. Vascular: No hyperdense vessel. Skull: Complex centrally nondisplaced right temporal bone fracture extends towards the parietal bone and involves the mastoid air cells. Small foci of subjacent pneumocephalus and soft tissue air. Mastoid air cell opacification on the right. Fracture extends to the right skull base with fluid levels in the sphenoid sinus. Fracture abuts the carotid canal. Sinuses/Orbits: Fluid levels in the right sphenoid sinus. Scattered opacification of ethmoid air cells. Other: Right parietal. CT CERVICAL SPINE FINDINGS Alignment: Trace retrolisthesis of C5 on C6 is likely degenerative. No traumatic subluxation. Skull base and vertebrae: No acute fracture. Vertebral body heights are maintained. The dens and skull base are intact. Soft tissues and spinal canal: No prevertebral fluid or swelling. No visible canal hematoma. Disc levels: Disc space narrowing and endplate spurring most prominent at C5-C6. Multilevel facet arthropathy. Upper chest: Emphysema. No acute findings. Other: None. IMPRESSION: 1. Complex right temporal bone fracture involving the mastoid air cells tracking to the skull base. Fracture extends to the region of the carotid canals, recommend CTA to exclude vascular injury. Foci of pneumocephalus subjacent to temporal fracture. 2. Small intraparenchymal  hemorrhage in the right temporal lobe, additional probable temporal contusions. 3. Multifocal scattered subarachnoid hemorrhage both right and left temporal as well as left frontal. 4. Left temporal subdural measuring 4 mm. Additional extra-axial hemorrhage in the left frontal region measures 8 mm in thickness, favoring focal subdural over epidural. 5. No fracture or subluxation of the cervical spine. Critical Value/emergent results were called by telephone at the time of interpretation on 11/19/2018 at 11:52 pm to [place] , who verbally acknowledged these results. Electronically Signed   By: Keith Rake M.D.   On: 11/19/2018 23:57   Ct Cervical Spine Wo Contrast  Result Date: 11/19/2018 CLINICAL DATA:  Head trauma, minor, GCS>=13, high clinical risk, initial exam; C-spine trauma, high clinical risk (NEXUS/CCR). Fall at home striking back of head. EXAM: CT HEAD WITHOUT CONTRAST CT CERVICAL SPINE WITHOUT CONTRAST TECHNIQUE: Multidetector CT imaging of the head and cervical spine was performed following the standard protocol without  intravenous contrast. Multiplanar CT image reconstructions of the cervical spine were also generated. COMPARISON:  None. FINDINGS: CT HEAD FINDINGS Brain: Left temporal hemorrhagic contusion measures 6 mm abutting the mastoid air cells. Additional vague intraparenchymal densities suspicious for additional contusions in the temporal occipital lobe. Multifocal subarachnoid hemorrhage in the right temporal, left temporal, and inferior frontal lobes. Foci of pneumocephalus subjacent to skull fracture. Left temporal subdural measures 4 mm, within additional 12 x 8 mm extra-axial hemorrhage in the left frontal lobe, likely additional subdural. No midline shift. No hydrocephalus. Basilar cisterns are patent. Vascular: No hyperdense vessel. Skull: Complex centrally nondisplaced right temporal bone fracture extends towards the parietal bone and involves the mastoid air cells. Small foci  of subjacent pneumocephalus and soft tissue air. Mastoid air cell opacification on the right. Fracture extends to the right skull base with fluid levels in the sphenoid sinus. Fracture abuts the carotid canal. Sinuses/Orbits: Fluid levels in the right sphenoid sinus. Scattered opacification of ethmoid air cells. Other: Right parietal. CT CERVICAL SPINE FINDINGS Alignment: Trace retrolisthesis of C5 on C6 is likely degenerative. No traumatic subluxation. Skull base and vertebrae: No acute fracture. Vertebral body heights are maintained. The dens and skull base are intact. Soft tissues and spinal canal: No prevertebral fluid or swelling. No visible canal hematoma. Disc levels: Disc space narrowing and endplate spurring most prominent at C5-C6. Multilevel facet arthropathy. Upper chest: Emphysema. No acute findings. Other: None. IMPRESSION: 1. Complex right temporal bone fracture involving the mastoid air cells tracking to the skull base. Fracture extends to the region of the carotid canals, recommend CTA to exclude vascular injury. Foci of pneumocephalus subjacent to temporal fracture. 2. Small intraparenchymal hemorrhage in the right temporal lobe, additional probable temporal contusions. 3. Multifocal scattered subarachnoid hemorrhage both right and left temporal as well as left frontal. 4. Left temporal subdural measuring 4 mm. Additional extra-axial hemorrhage in the left frontal region measures 8 mm in thickness, favoring focal subdural over epidural. 5. No fracture or subluxation of the cervical spine. Critical Value/emergent results were called by telephone at the time of interpretation on 11/19/2018 at 11:52 pm to [place] , who verbally acknowledged these results. Electronically Signed   By: Keith Rake M.D.   On: 11/19/2018 23:57    Pending Labs Unresulted Labs (From admission, onward)    Start     Ordered   11/20/18 0026  SARS Coronavirus 2 (CEPHEID- Performed in [place] hospital lab), Hosp  Order  (Symptomatic Patients Labs with Precautions )  ONCE - STAT,   STAT    Question:  Patient immune status  Answer:  Normal   11/20/18 0025          Vitals/Pain Today's Vitals   11/20/18 0000 11/20/18 0007 11/20/18 0101 11/20/18 0104  BP: 121/78   (!) 144/59  Pulse: 89   94  Resp: 18   18  Temp:      SpO2: 98%   100%  Weight:      Height:      PainSc:  0-No pain 5      Isolation Precautions No active isolations  Medications Medications  sodium chloride 0.9 % bolus 500 mL (has no administration in time range)    Followed by  0.9 %  sodium chloride infusion (1,000 mLs Intravenous New Bag/Given 11/19/18 2352)  Tdap (BOOSTRIX) injection 0.5 mL (0.5 mLs Intramuscular Given 11/20/18 0142)  lidocaine-EPINEPHrine (XYLOCAINE W/EPI) 2 %-1:200000 (PF) injection 10 mL (10 mLs Intradermal Given 11/20/18 0035)  iohexol (OMNIPAQUE) 350 MG/ML injection 100 mL (100 mLs Intravenous Contrast Given 11/20/18 0039)  potassium chloride SA (K-DUR) CR tablet 40 mEq (40 mEq Oral Given 11/20/18 0156)  magnesium oxide (MAG-OX) tablet 800 mg (800 mg Oral Given 11/20/18 0154)    Mobility walks High fall risk   Focused Assessments    R Recommendations: See Admitting Provider Note  Report given to:   Additional Notes:

## 2018-11-21 LAB — CBC WITH DIFFERENTIAL/PLATELET
Abs Immature Granulocytes: 0.02 10*3/uL (ref 0.00–0.07)
Basophils Absolute: 0 10*3/uL (ref 0.0–0.1)
Basophils Relative: 0 %
Eosinophils Absolute: 0.1 10*3/uL (ref 0.0–0.5)
Eosinophils Relative: 2 %
HCT: 38.8 % (ref 36.0–46.0)
Hemoglobin: 12.8 g/dL (ref 12.0–15.0)
Immature Granulocytes: 0 %
Lymphocytes Relative: 21 %
Lymphs Abs: 1.6 10*3/uL (ref 0.7–4.0)
MCH: 29.8 pg (ref 26.0–34.0)
MCHC: 33 g/dL (ref 30.0–36.0)
MCV: 90.4 fL (ref 80.0–100.0)
Monocytes Absolute: 0.9 10*3/uL (ref 0.1–1.0)
Monocytes Relative: 12 %
Neutro Abs: 5.1 10*3/uL (ref 1.7–7.7)
Neutrophils Relative %: 65 %
Platelets: 238 10*3/uL (ref 150–400)
RBC: 4.29 MIL/uL (ref 3.87–5.11)
RDW: 14.4 % (ref 11.5–15.5)
WBC: 7.8 10*3/uL (ref 4.0–10.5)
nRBC: 0 % (ref 0.0–0.2)

## 2018-11-21 LAB — BASIC METABOLIC PANEL
Anion gap: 7 (ref 5–15)
BUN: 7 mg/dL — ABNORMAL LOW (ref 8–23)
CO2: 23 mmol/L (ref 22–32)
Calcium: 8.3 mg/dL — ABNORMAL LOW (ref 8.9–10.3)
Chloride: 109 mmol/L (ref 98–111)
Creatinine, Ser: 0.34 mg/dL — ABNORMAL LOW (ref 0.44–1.00)
GFR calc Af Amer: >60 mL/min (ref >60)
GFR calc non Af Amer: >60 mL/min (ref >60)
Glucose, Bld: 104 mg/dL — ABNORMAL HIGH (ref 70–99)
Potassium: 3.8 mmol/L (ref 3.5–5.1)
Sodium: 139 mmol/L (ref 135–145)

## 2018-11-21 LAB — HIV ANTIBODY (ROUTINE TESTING W REFLEX): HIV Screen 4th Generation wRfx: NONREACTIVE

## 2018-11-21 MED ORDER — NEOMYCIN-POLYMYXIN-HC 3.5-10000-1 OT SUSP: 4.0000 [drp] | Freq: Four times a day (QID) | OTIC | 0 refills | Status: DC

## 2018-11-21 MED ORDER — HYOSCYAMINE SULFATE 0.125 MG SL SUBL
0.1250 mg | SUBLINGUAL_TABLET | Freq: Two times a day (BID) | SUBLINGUAL | Status: DC | PRN
  Administered 2018-11-21: 0.125 mg via SUBLINGUAL
  Filled 2018-11-21 (×2): qty 1

## 2018-11-21 MED ORDER — NEOMYCIN-POLYMYXIN-HC 3.5-10000-1 OT SUSP
4.0000 [drp] | Freq: Four times a day (QID) | OTIC | Status: DC
  Filled 2018-11-21: qty 10

## 2018-11-21 MED ORDER — ACETAMINOPHEN 325 MG PO TABS: 650.0000 mg | ORAL_TABLET | Freq: Four times a day (QID) | ORAL | Status: DC | PRN

## 2018-11-21 MED ORDER — LORATADINE 10 MG PO TABS: 10.0000 mg | ORAL_TABLET | Freq: Every day | ORAL | Status: DC | PRN

## 2018-11-21 NOTE — Progress Notes (Signed)
PT Cancellation Note  Patient Details Name: Sharon Fox MRN: [id number] DOB: 11/03/1950   Cancelled Treatment:    Reason Eval/Treat Not Completed: Patient declined, no reason specified.  Pt politely declined PT services today reporting, "I am leaving in 40 mins."  I explained that her therapist yesterday wanted me to come by to make sure she did not get nauseated or lightheaded again with gait today.  She politely refused my services and reports she has a RW she can use and her husband who will be there with her all the time.  PT will check back tomorrow if she happens to not d/c home later today.   Barbarann Ehlers Krystall Kruckenberg, PT, DPT  Acute Rehabilitation [phone] pager [phone] office  @ [facility]: [phone]     Harvie Heck 11/21/2018, 1:34 PM

## 2018-11-21 NOTE — Progress Notes (Signed)
Patient is discharging home. Husband is here for transport. Discharge paperwork went over with patient. All questions and concerns addressed.  All belongings sent with patient. Patient taken down in wheelchair. [place]

## 2018-11-21 NOTE — Progress Notes (Signed)
Overall stable.  Patient still complaining of some bleeding in her right ear and decreased hearing on her right side.  No significant headache.  Motor and sensory exam otherwise intact.  Status post traumatic brain injury with scattered traumatic subarachnoid hemorrhage.  Patient also with right temporal bone fracture with decreased hearing on the right.  Continue supportive care.  Patient safe for discharge from my standpoint.

## 2018-11-21 NOTE — Discharge Summary (Signed)
Physician Discharge Summary  Sharon Fox FBP:[id number] DOB: 12/05/1950 DOA: 11/19/2018  PCP: [name], MD  Admit date: 11/19/2018 Discharge date: 11/21/2018  Admitted From: Home Disposition: Home  Recommendations for Outpatient Follow-up:  1. Follow up with PCP in 1-2 weeks 2. Please obtain BMP/CBC in one week 3. Please follow up with Dr. [name] neurosurgery in 2 weeks 4. Follow-up with Dr. [name] ENT in 1 week  Home Health: None Equipment/Devices: None Discharge Condition: Stable and improved  CODE STATUS full code Diet recommendation: Cardiac  brief/Interim Summary:68 y.o. female with medical history significant of no significant past medical history who was at home with her husband had a few glasses of wine together.  She was going upstairs get 2 cups of water for them when she slipped and fell.  Husband was in a different room when he heard a loud noise and found patient on the floor.  She had no recollection of events afterwards until she woke up in the hospital.  Patient had hospital laceration on her skull and subsequent scans showed scattered intracranial hemorrhages as well as right temporal lobe fracture.  Neurosurgery and ENT have been contacted.  Neurosurgery will consult in the hospital while ENT recommends outpatient follow-up.  Patient is being admitted to the hospital for management of these intracranial hemorrhages and possible cause of her fall.  She is awake now with some headaches.  Denied any seizure disorder.  Denied any prodrome.  Patient also denied any prior falls.  Her gait has been fine..  ED Course: Temperature 97.7 blood pressure 99/52 with a pulse 96 respiratory rate of 22 oxygen sat 93% on room air.  CBC entirely within normal.  Chemistry showed a potassium of 2.8 and calcium 8.7.  Creatinine is normal at 0.51.  Alcohol level 182, CT neck and head without contrast showed complex right temporal bone fracture as well as small intraparenchymal hemorrhage  in the right temporal lobe with multifocal scattered subarachnoid hemorrhage both right and left temporal as well as left frontal.  There was also a left temporal subdural measuring 4 mm with additional ones in the left frontal region about 8 mm in thickness.  Case discussed with Dr. Deri Fuelling of neurosurgery as well as Dr. [name] who both recommend admission.  Patient is being admitted to the medical service.   Discharge Diagnoses:  Principal Problem:   Intracranial bleed ([facility]) Active Problems:   Hypokalemia   Alcohol abuse with intoxication ([place])   Temporal bone fracture ([facility])   #1 traumatic brain injury status post fall-patient was admitted to the telemetry floor.  Initial CT scan showedComplex right temporal bone fracture involving the mastoid air cells tracking to the skull base. Fracture extends to the region of the carotid canals, recommend CTA to exclude vascular injury. Foci of pneumocephalus subjacent to temporal fracture.  Small intraparenchymal hemorrhage in the right temporal lobe, additional probable temporal contusions.. Multifocal scattered subarachnoid hemorrhage both right and left temporal as well as left frontal. Left temporal subdural measuring 4 mm. Additional extra-axial hemorrhage in the left frontal region measures 8 mm in thickness, favoring focal subdural over epidural.  No fracture or subluxation of the cervical spine  Repeat CT scanStable CT head from yesterday  Right temporal lobe hemorrhagic contusion and right temporal subarachnoid hemorrhage stable. 5 mm left temporal subdural hematoma and associated subarachnoid hemorrhage unchanged. No midline shift.  Fracture through the right mastoid sinus into the sphenoid sinus. Nondepressed right temporal and parietal skull fracture  CT angiogram of  the head and neckNo acute vascular injury identified. Patent carotid and vertebral arteries of the neck. Patent anterior and posterior  intracranial circulation. No large vessel occlusion, dissection, aneurysm, or significant stenosis.  Patient was seen by Dr. [name] with neurosurgery recommended follow-up in 2 weeks with him.  I also discussed with Dr. [name] regarding the above fractures he recommended to start antibiotic eardrops and follow-up with him in 1 week for audiogram. Patient was seen by physical therapy recommended no home PT.  #2 alcohol intoxication no signs of withdrawal during the hospital stay her alcohol level was 182 urine drug screen was negative.  She was counseled on cessation of alcohol.  She did report she will be very careful in the future she had company and she did not know how much she drank.  #3 hypokalemia potassium 2.8 at the time of admission probably related to alcohol intake this was replaced and normal prior to discharge.  Estimated body mass index is 56.05 kg/m as calculated from the following:   Height as of this encounter: 5\' 2"  (1.575 m).   Weight as of this encounter: 139 kg.  Discharge Instructions  Discharge Instructions    Call MD for:  difficulty breathing, headache or visual disturbances   Complete by: As directed    Call MD for:  persistant dizziness or light-headedness   Complete by: As directed    Call MD for:  persistant nausea and vomiting   Complete by: As directed    Call MD for:  severe uncontrolled pain   Complete by: As directed    Call MD for:  temperature >100.4   Complete by: As directed    Diet - low sodium heart healthy   Complete by: As directed    Increase activity slowly   Complete by: As directed      Allergies as of 11/21/2018      Reactions   Ciprofloxacin Rash      Medication List    STOP taking these medications   zolpidem 5 MG tablet Commonly known as: AMBIEN     TAKE these medications   acetaminophen 325 MG tablet Commonly known as: TYLENOL Take 2 tablets (650 mg total) by mouth every 6 (six) hours as needed for headache.    ALIGN PO Take 1 tablet by mouth daily.   cholecalciferol 25 MCG (1000 UT) tablet Commonly known as: VITAMIN D3 Take 1,000 Units by mouth daily.   Estring 2 MG vaginal ring Generic drug: estradiol Place 1 application vaginally every 3 (three) months.   hyoscyamine 0.125 MG Tbdp disintergrating tablet Commonly known as: ANASPAZ Place 1 tablet under the tongue 2 (two) times daily as needed for spasms.   loratadine 10 MG tablet Commonly known as: CLARITIN Take 1 tablet (10 mg total) by mouth daily as needed for allergies.   multivitamin with minerals Tabs tablet Take 1 tablet by mouth daily.   neomycin-polymyxin-hydrocortisone 3.5-10000-1 OTIC suspension Commonly known as: CORTISPORIN Place 4 drops into the right ear every 6 (six) hours.      Follow-up Information    [name], MD Follow up.   Specialty: Internal Medicine Contact information: [address] [address] [phone]        Jerrell Belfast, MD Follow up.   Specialty: Otolaryngology Why: please call to make appointment  follow up in one week Contact information: [address] [place] [address] [phone]        Earnie Larsson, MD  Follow up.   Specialty: Neurosurgery Why: call for appointment  follow up in 2 weeks Contact information: [address] [place] [address] [phone]          Allergies  Allergen Reactions  . Ciprofloxacin Rash    Consultations: Dr. [name] with neurosurgery Procedures/Studies: Ct Angio Head W Or Wo Contrast  Result Date: 11/20/2018 CLINICAL DATA:  68 y/o  F; posttraumatic headache. EXAM: CT ANGIOGRAPHY HEAD AND NECK TECHNIQUE: Multidetector CT imaging of the head and neck was performed using the standard protocol during bolus administration of intravenous contrast. Multiplanar CT image reconstructions and MIPs were obtained to evaluate the vascular anatomy. Carotid stenosis measurements  (when applicable) are obtained utilizing NASCET criteria, using the distal internal carotid diameter as the denominator. CONTRAST:  163mL OMNIPAQUE IOHEXOL 350 MG/ML SOLN COMPARISON:  11/19/2018 CT of the head and cervical spine. FINDINGS: CTA NECK FINDINGS Aortic arch: Aberrant right subclavian artery. Imaged portion shows no evidence of aneurysm or dissection. No significant stenosis of the major arch vessel origins. Right carotid system: No evidence of dissection, stenosis (50% or greater) or occlusion. Left carotid system: No evidence of dissection, stenosis (50% or greater) or occlusion. Vertebral arteries: Codominant. No evidence of dissection, stenosis (50% or greater) or occlusion. Skeleton: No acute fracture cervical spine. Mild cervical spondylosis with discogenic degenerative changes greatest at the C5-6 level and multilevel facet arthrosis. Other neck: Negative. Upper chest: Negative. Review of the MIP images confirms the above findings CTA HEAD FINDINGS Anterior circulation: No significant stenosis, proximal occlusion, aneurysm, or vascular malformation. Non stenotic calcific atherosclerosis of the internal carotid arteries. Posterior circulation: No significant stenosis, proximal occlusion, aneurysm, or vascular malformation. Venous sinuses: As permitted by contrast timing, patent. Anatomic variants: None significant. Right temporal bone and skull base fracture and intracranial hemorrhage as characterized on prior CT of head. Review of the MIP images confirms the above findings IMPRESSION: No acute vascular injury identified. Patent carotid and vertebral arteries of the neck. Patent anterior and posterior intracranial circulation. No large vessel occlusion, dissection, aneurysm, or significant stenosis. Electronically Signed   By: Kristine Garbe M.D.   On: 11/20/2018 01:15   Ct Head Wo Contrast  Result Date: 11/20/2018 CLINICAL DATA:  Intracranial hemorrhage from fall EXAM: CT HEAD  WITHOUT CONTRAST TECHNIQUE: Contiguous axial images were obtained from the base of the skull through the vertex without intravenous contrast. COMPARISON:  CT head 11/19/2018 FINDINGS: Brain: Small parenchymal hemorrhage in the right temporal lobe just above the mastoid sinus unchanged in size. Mild subarachnoid hemorrhage in the right temporal lobe unchanged. Small left temporal subdural hematoma measuring 5 mm, unchanged. Mild subarachnoid hemorrhage left temporal lobe also unchanged. Ventricle size normal.  No midline shift.  No acute infarct or mass. Vascular: Negative for hyperdense vessel Skull: Fractures of the right mastoid sinus extending into the sphenoid sinus on the right. Right mastoid effusion and air-fluid level right sphenoid sinus compatible with blood. Nondepressed fracture right temporal bone extending into the right parietal bone. Sinuses/Orbits: Air-fluid level right sphenoid sinus. Right mastoid effusion. Associated fractures as above. Remainder of the sinuses clear. Negative orbit Other: None IMPRESSION: Stable CT head from yesterday Right temporal lobe hemorrhagic contusion and right temporal subarachnoid hemorrhage stable. 5 mm left temporal subdural hematoma and associated subarachnoid hemorrhage unchanged. No midline shift. Fracture through the right mastoid sinus into the sphenoid sinus. Nondepressed right temporal and parietal skull fracture. Electronically Signed   By: Franchot Gallo M.D.   On: 11/20/2018  08:46   Ct Head Wo Contrast  Result Date: 11/19/2018 CLINICAL DATA:  Head trauma, minor, GCS>=13, high clinical risk, initial exam; C-spine trauma, high clinical risk (NEXUS/CCR). Fall at home striking back of head. EXAM: CT HEAD WITHOUT CONTRAST CT CERVICAL SPINE WITHOUT CONTRAST TECHNIQUE: Multidetector CT imaging of the head and cervical spine was performed following the standard protocol without intravenous contrast. Multiplanar CT image reconstructions of the cervical spine  were also generated. COMPARISON:  None. FINDINGS: CT HEAD FINDINGS Brain: Left temporal hemorrhagic contusion measures 6 mm abutting the mastoid air cells. Additional vague intraparenchymal densities suspicious for additional contusions in the temporal occipital lobe. Multifocal subarachnoid hemorrhage in the right temporal, left temporal, and inferior frontal lobes. Foci of pneumocephalus subjacent to skull fracture. Left temporal subdural measures 4 mm, within additional 12 x 8 mm extra-axial hemorrhage in the left frontal lobe, likely additional subdural. No midline shift. No hydrocephalus. Basilar cisterns are patent. Vascular: No hyperdense vessel. Skull: Complex centrally nondisplaced right temporal bone fracture extends towards the parietal bone and involves the mastoid air cells. Small foci of subjacent pneumocephalus and soft tissue air. Mastoid air cell opacification on the right. Fracture extends to the right skull base with fluid levels in the sphenoid sinus. Fracture abuts the carotid canal. Sinuses/Orbits: Fluid levels in the right sphenoid sinus. Scattered opacification of ethmoid air cells. Other: Right parietal. CT CERVICAL SPINE FINDINGS Alignment: Trace retrolisthesis of C5 on C6 is likely degenerative. No traumatic subluxation. Skull base and vertebrae: No acute fracture. Vertebral body heights are maintained. The dens and skull base are intact. Soft tissues and spinal canal: No prevertebral fluid or swelling. No visible canal hematoma. Disc levels: Disc space narrowing and endplate spurring most prominent at C5-C6. Multilevel facet arthropathy. Upper chest: Emphysema. No acute findings. Other: None. IMPRESSION: 1. Complex right temporal bone fracture involving the mastoid air cells tracking to the skull base. Fracture extends to the region of the carotid canals, recommend CTA to exclude vascular injury. Foci of pneumocephalus subjacent to temporal fracture. 2. Small intraparenchymal hemorrhage  in the right temporal lobe, additional probable temporal contusions. 3. Multifocal scattered subarachnoid hemorrhage both right and left temporal as well as left frontal. 4. Left temporal subdural measuring 4 mm. Additional extra-axial hemorrhage in the left frontal region measures 8 mm in thickness, favoring focal subdural over epidural. 5. No fracture or subluxation of the cervical spine. Critical Value/emergent results were called by telephone at the time of interpretation on 11/19/2018 at 11:52 pm to [place] , who verbally acknowledged these results. Electronically Signed   By: Keith Rake M.D.   On: 11/19/2018 23:57   Ct Angio Neck W And/or Wo Contrast  Result Date: 11/20/2018 CLINICAL DATA:  68 y/o  F; posttraumatic headache. EXAM: CT ANGIOGRAPHY HEAD AND NECK TECHNIQUE: Multidetector CT imaging of the head and neck was performed using the standard protocol during bolus administration of intravenous contrast. Multiplanar CT image reconstructions and MIPs were obtained to evaluate the vascular anatomy. Carotid stenosis measurements (when applicable) are obtained utilizing NASCET criteria, using the distal internal carotid diameter as the denominator. CONTRAST:  157mL OMNIPAQUE IOHEXOL 350 MG/ML SOLN COMPARISON:  11/19/2018 CT of the head and cervical spine. FINDINGS: CTA NECK FINDINGS Aortic arch: Aberrant right subclavian artery. Imaged portion shows no evidence of aneurysm or dissection. No significant stenosis of the major arch vessel origins. Right carotid system: No evidence of dissection, stenosis (50% or greater) or occlusion. Left carotid system: No evidence of dissection, stenosis (50%  or greater) or occlusion. Vertebral arteries: Codominant. No evidence of dissection, stenosis (50% or greater) or occlusion. Skeleton: No acute fracture cervical spine. Mild cervical spondylosis with discogenic degenerative changes greatest at the C5-6 level and multilevel facet arthrosis. Other neck: Negative.  Upper chest: Negative. Review of the MIP images confirms the above findings CTA HEAD FINDINGS Anterior circulation: No significant stenosis, proximal occlusion, aneurysm, or vascular malformation. Non stenotic calcific atherosclerosis of the internal carotid arteries. Posterior circulation: No significant stenosis, proximal occlusion, aneurysm, or vascular malformation. Venous sinuses: As permitted by contrast timing, patent. Anatomic variants: None significant. Right temporal bone and skull base fracture and intracranial hemorrhage as characterized on prior CT of head. Review of the MIP images confirms the above findings IMPRESSION: No acute vascular injury identified. Patent carotid and vertebral arteries of the neck. Patent anterior and posterior intracranial circulation. No large vessel occlusion, dissection, aneurysm, or significant stenosis. Electronically Signed   By: Kristine Garbe M.D.   On: 11/20/2018 01:15   Ct Cervical Spine Wo Contrast  Result Date: 11/19/2018 CLINICAL DATA:  Head trauma, minor, GCS>=13, high clinical risk, initial exam; C-spine trauma, high clinical risk (NEXUS/CCR). Fall at home striking back of head. EXAM: CT HEAD WITHOUT CONTRAST CT CERVICAL SPINE WITHOUT CONTRAST TECHNIQUE: Multidetector CT imaging of the head and cervical spine was performed following the standard protocol without intravenous contrast. Multiplanar CT image reconstructions of the cervical spine were also generated. COMPARISON:  None. FINDINGS: CT HEAD FINDINGS Brain: Left temporal hemorrhagic contusion measures 6 mm abutting the mastoid air cells. Additional vague intraparenchymal densities suspicious for additional contusions in the temporal occipital lobe. Multifocal subarachnoid hemorrhage in the right temporal, left temporal, and inferior frontal lobes. Foci of pneumocephalus subjacent to skull fracture. Left temporal subdural measures 4 mm, within additional 12 x 8 mm extra-axial hemorrhage in  the left frontal lobe, likely additional subdural. No midline shift. No hydrocephalus. Basilar cisterns are patent. Vascular: No hyperdense vessel. Skull: Complex centrally nondisplaced right temporal bone fracture extends towards the parietal bone and involves the mastoid air cells. Small foci of subjacent pneumocephalus and soft tissue air. Mastoid air cell opacification on the right. Fracture extends to the right skull base with fluid levels in the sphenoid sinus. Fracture abuts the carotid canal. Sinuses/Orbits: Fluid levels in the right sphenoid sinus. Scattered opacification of ethmoid air cells. Other: Right parietal. CT CERVICAL SPINE FINDINGS Alignment: Trace retrolisthesis of C5 on C6 is likely degenerative. No traumatic subluxation. Skull base and vertebrae: No acute fracture. Vertebral body heights are maintained. The dens and skull base are intact. Soft tissues and spinal canal: No prevertebral fluid or swelling. No visible canal hematoma. Disc levels: Disc space narrowing and endplate spurring most prominent at C5-C6. Multilevel facet arthropathy. Upper chest: Emphysema. No acute findings. Other: None. IMPRESSION: 1. Complex right temporal bone fracture involving the mastoid air cells tracking to the skull base. Fracture extends to the region of the carotid canals, recommend CTA to exclude vascular injury. Foci of pneumocephalus subjacent to temporal fracture. 2. Small intraparenchymal hemorrhage in the right temporal lobe, additional probable temporal contusions. 3. Multifocal scattered subarachnoid hemorrhage both right and left temporal as well as left frontal. 4. Left temporal subdural measuring 4 mm. Additional extra-axial hemorrhage in the left frontal region measures 8 mm in thickness, favoring focal subdural over epidural. 5. No fracture or subluxation of the cervical spine. Critical Value/emergent results were called by telephone at the time of interpretation on 11/19/2018 at 11:52 pm to PA  [place] , who verbally acknowledged these results. Electronically Signed   By: Keith Rake M.D.   On: 11/19/2018 23:57    (Echo, Carotid, EGD, Colonoscopy, ERCP)    Subjective:  Patient resting in bed very anxious had a lot of questions all answered to the best of my knowledge.  Still having some bleeding from the right ear with decreased hearing however the bleeding has decreased compared to yesterday Discharge Exam: Vitals:   11/21/18 0304 11/21/18 0830  BP: 132/67 (!) 141/76  Pulse: 72 78  Resp: 15 12  Temp: 98.3 F (36.8 C) 98.3 F (36.8 C)  SpO2: 99% 99%   Vitals:   11/20/18 1918 11/20/18 2347 11/21/18 0304 11/21/18 0830  BP: (!) 151/75 133/66 132/67 (!) 141/76  Pulse: 82 73 72 78  Resp: 15 15 15 12   Temp: 98.4 F (36.9 C) 98.4 F (36.9 C) 98.3 F (36.8 C) 98.3 F (36.8 C)  TempSrc: Oral Oral Oral Oral  SpO2: 100% 98% 99% 99%  Weight:      Height:        General: Pt is alert, awake, not in acute distress Cardiovascular: RRR, S1/S2 +, no rubs, no gallops Respiratory: CTA bilaterally, no wheezing, no rhonchi Abdominal: Soft, NT, ND, bowel sounds + Extremities: no edema, no cyanosis Skin staples on the back of her head noted   The results of significant diagnostics from this hospitalization (including imaging, microbiology, ancillary and laboratory) are listed below for reference.     Microbiology: Recent Results (from the past 240 hour(s))  SARS Coronavirus 2 (CEPHEID- Performed in [place] hospital lab), Hosp Order     Status: None   Collection Time: 11/20/18  1:40 AM   Specimen: Nasopharyngeal Swab  Result Value Ref Range Status   SARS Coronavirus 2 NEGATIVE NEGATIVE Final    Comment: (NOTE) If result is NEGATIVE SARS-CoV-2 target nucleic acids are NOT DETECTED. The SARS-CoV-2 RNA is generally detectable in upper and lower  respiratory specimens during the acute phase of infection. The lowest  concentration of SARS-CoV-2 viral copies this assay  can detect is 250  copies / mL. A negative result does not preclude SARS-CoV-2 infection  and should not be used as the sole basis for treatment or other  patient management decisions.  A negative result may occur with  improper specimen collection / handling, submission of specimen other  than nasopharyngeal swab, presence of viral mutation(s) within the  areas targeted by this assay, and inadequate number of viral copies  (<250 copies / mL). A negative result must be combined with clinical  observations, patient history, and epidemiological information. If result is POSITIVE SARS-CoV-2 target nucleic acids are DETECTED. The SARS-CoV-2 RNA is generally detectable in upper and lower  respiratory specimens dur ing the acute phase of infection.  Positive  results are indicative of active infection with SARS-CoV-2.  Clinical  correlation with patient history and other diagnostic information is  necessary to determine patient infection status.  Positive results do  not rule out bacterial infection or co-infection with other viruses. If result is PRESUMPTIVE POSTIVE SARS-CoV-2 nucleic acids MAY BE PRESENT.   A presumptive positive result was obtained on the submitted specimen  and confirmed on repeat testing.  While 2019 novel coronavirus  (SARS-CoV-2) nucleic acids may be present in the submitted sample  additional confirmatory testing may be necessary for epidemiological  and / or clinical management purposes  to differentiate between  SARS-CoV-2 and other Sarbecovirus currently known to infect humans.  If clinically indicated additional testing with an alternate test  methodology [phone]) is advised. The SARS-CoV-2 RNA is generally  detectable in upper and lower respiratory sp ecimens during the acute  phase of infection. The expected result is Negative. Fact Sheet for Patients:  StrictlyIdeas.no Fact Sheet for Healthcare  Providers: BankingDealers.co.za This test is not yet approved or cleared by the [place] FDA and has been authorized for detection and/or diagnosis of SARS-CoV-2 by FDA under an Emergency Use Authorization (EUA).  This EUA will remain in effect (meaning this test can be used) for the duration of the COVID-19 declaration under Section 564(b)(1) of the Act, 21 U.S.C. section 360bbb-3(b)(1), unless the authorization is terminated or revoked sooner. Performed at [facility], [place] [address], [place], [place] [address]      Labs: BNP (last 3 results) No results for input(s): BNP in the last 8760 hours. Basic Metabolic Panel: Recent Labs  Lab 11/19/18 2255 11/20/18 1059 11/21/18 0544  NA 141 139 139  K 2.8* 4.5 3.8  CL 105 106 109  CO2 26 24 23   GLUCOSE 116* 126* 104*  BUN 14 12 7*  CREATININE 0.51 0.46 0.34*  CALCIUM 8.7* 8.7* 8.3*  MG  --  2.0  --    Liver Function Tests: Recent Labs  Lab 11/20/18 1059  AST 30  ALT 27  ALKPHOS 67  BILITOT 0.6  PROT 6.4*  ALBUMIN 3.4*   No results for input(s): LIPASE, AMYLASE in the last 168 hours. No results for input(s): AMMONIA in the last 168 hours. CBC: Recent Labs  Lab 11/19/18 2255 11/20/18 1059 11/21/18 0544  WBC 8.6 9.8 7.8  NEUTROABS  --   --  5.1  HGB 13.7 12.8 12.8  HCT 41.5 38.5 38.8  MCV 91.2 89.7 90.4  PLT 294 258 238   Cardiac Enzymes: No results for input(s): CKTOTAL, CKMB, CKMBINDEX, TROPONINI in the last 168 hours. BNP: Invalid input(s): POCBNP CBG: No results for input(s): GLUCAP in the last 168 hours. D-Dimer No results for input(s): DDIMER in the last 72 hours. Hgb A1c No results for input(s): HGBA1C in the last 72 hours. Lipid Profile No results for input(s): CHOL, HDL, LDLCALC, TRIG, CHOLHDL, LDLDIRECT in the last 72 hours. Thyroid function studies No results for input(s): TSH, T4TOTAL, T3FREE, THYROIDAB in the last 72 hours.  Invalid input(s): FREET3 Anemia  work up No results for input(s): VITAMINB12, FOLATE, FERRITIN, TIBC, IRON, RETICCTPCT in the last 72 hours. Urinalysis No results found for: COLORURINE, APPEARANCEUR, [place], [place], GLUCOSEU, [place], [place], KETONESUR, PROTEINUR, UROBILINOGEN, NITRITE, LEUKOCYTESUR Sepsis Labs Invalid input(s): PROCALCITONIN,  WBC,  LACTICIDVEN Microbiology Recent Results (from the past 240 hour(s))  SARS Coronavirus 2 (CEPHEID- Performed in [facility] lab), Hosp Order     Status: None   Collection Time: 11/20/18  1:40 AM   Specimen: Nasopharyngeal Swab  Result Value Ref Range Status   SARS Coronavirus 2 NEGATIVE NEGATIVE Final    Comment: (NOTE) If result is NEGATIVE SARS-CoV-2 target nucleic acids are NOT DETECTED. The SARS-CoV-2 RNA is generally detectable in upper and lower  respiratory specimens during the acute phase of infection. The lowest  concentration of SARS-CoV-2 viral copies this assay can detect is 250  copies / mL. A negative result does not preclude SARS-CoV-2 infection  and should not be used as the sole basis for treatment or other  patient management decisions.  A negative result may occur with  improper specimen collection / handling, submission of specimen  other  than nasopharyngeal swab, presence of viral mutation(s) within the  areas targeted by this assay, and inadequate number of viral copies  (<250 copies / mL). A negative result must be combined with clinical  observations, patient history, and epidemiological information. If result is POSITIVE SARS-CoV-2 target nucleic acids are DETECTED. The SARS-CoV-2 RNA is generally detectable in upper and lower  respiratory specimens dur ing the acute phase of infection.  Positive  results are indicative of active infection with SARS-CoV-2.  Clinical  correlation with patient history and other diagnostic information is  necessary to determine patient infection status.  Positive results do  not rule out bacterial  infection or co-infection with other viruses. If result is PRESUMPTIVE POSTIVE SARS-CoV-2 nucleic acids MAY BE PRESENT.   A presumptive positive result was obtained on the submitted specimen  and confirmed on repeat testing.  While 2019 novel coronavirus  (SARS-CoV-2) nucleic acids may be present in the submitted sample  additional confirmatory testing may be necessary for epidemiological  and / or clinical management purposes  to differentiate between  SARS-CoV-2 and other Sarbecovirus currently known to infect humans.  If clinically indicated additional testing with an alternate test  methodology [phone]) is advised. The SARS-CoV-2 RNA is generally  detectable in upper and lower respiratory sp ecimens during the acute  phase of infection. The expected result is Negative. Fact Sheet for Patients:  StrictlyIdeas.no Fact Sheet for Healthcare Providers: BankingDealers.co.za This test is not yet approved or cleared by the [place] FDA and has been authorized for detection and/or diagnosis of SARS-CoV-2 by FDA under an Emergency Use Authorization (EUA).  This EUA will remain in effect (meaning this test can be used) for the duration of the COVID-19 declaration under Section 564(b)(1) of the Act, 21 U.S.C. section 360bbb-3(b)(1), unless the authorization is terminated or revoked sooner. Performed at [facility], [place] [address] [place], [place] [address]      Time coordinating discharge: 33  minutes  SIGNED:   Georgette Shell, MD  [facility] 11/21/2018, 9:27 AM Pager   If 7PM-7AM, please contact night-coverage [email or web address] Password [email or web address]

## 2018-11-22 ENCOUNTER — Encounter: Payer: Self-pay | Admitting: Internal Medicine

## 2018-11-24 DIAGNOSIS — H9211 Otorrhea, right ear: Secondary | ICD-10-CM | POA: Diagnosis not present

## 2018-11-24 DIAGNOSIS — H61891 Other specified disorders of right external ear: Secondary | ICD-10-CM | POA: Diagnosis not present

## 2018-11-24 DIAGNOSIS — H90A22 Sensorineural hearing loss, unilateral, left ear, with restricted hearing on the contralateral side: Secondary | ICD-10-CM | POA: Diagnosis not present

## 2018-11-24 DIAGNOSIS — H90A31 Mixed conductive and sensorineural hearing loss, unilateral, right ear with restricted hearing on the contralateral side: Secondary | ICD-10-CM | POA: Diagnosis not present

## 2018-11-24 DIAGNOSIS — H90A12 Conductive hearing loss, unilateral, left ear with restricted hearing on the contralateral side: Secondary | ICD-10-CM | POA: Diagnosis not present

## 2018-11-24 DIAGNOSIS — S0219XA Other fracture of base of skull, initial encounter for closed fracture: Secondary | ICD-10-CM | POA: Diagnosis not present

## 2018-11-25 DIAGNOSIS — S069X1A Unspecified intracranial injury with loss of consciousness of 30 minutes or less, initial encounter: Secondary | ICD-10-CM | POA: Diagnosis not present

## 2018-11-30 DIAGNOSIS — D2261 Melanocytic nevi of right upper limb, including shoulder: Secondary | ICD-10-CM | POA: Diagnosis not present

## 2018-11-30 DIAGNOSIS — L309 Dermatitis, unspecified: Secondary | ICD-10-CM | POA: Diagnosis not present

## 2018-11-30 DIAGNOSIS — L821 Other seborrheic keratosis: Secondary | ICD-10-CM | POA: Diagnosis not present

## 2018-11-30 DIAGNOSIS — D225 Melanocytic nevi of trunk: Secondary | ICD-10-CM | POA: Diagnosis not present

## 2018-11-30 DIAGNOSIS — D235 Other benign neoplasm of skin of trunk: Secondary | ICD-10-CM | POA: Diagnosis not present

## 2018-12-01 DIAGNOSIS — I629 Nontraumatic intracranial hemorrhage, unspecified: Secondary | ICD-10-CM | POA: Diagnosis not present

## 2018-12-01 DIAGNOSIS — S069X0A Unspecified intracranial injury without loss of consciousness, initial encounter: Secondary | ICD-10-CM | POA: Diagnosis not present

## 2018-12-21 ENCOUNTER — Other Ambulatory Visit: Payer: Self-pay | Admitting: Neurosurgery

## 2018-12-21 DIAGNOSIS — S069X1A Unspecified intracranial injury with loss of consciousness of 30 minutes or less, initial encounter: Secondary | ICD-10-CM

## 2018-12-27 DIAGNOSIS — Z1231 Encounter for screening mammogram for malignant neoplasm of breast: Secondary | ICD-10-CM | POA: Diagnosis not present

## 2018-12-27 DIAGNOSIS — Z803 Family history of malignant neoplasm of breast: Secondary | ICD-10-CM | POA: Diagnosis not present

## 2018-12-28 DIAGNOSIS — H9211 Otorrhea, right ear: Secondary | ICD-10-CM | POA: Diagnosis not present

## 2018-12-28 DIAGNOSIS — S0219XA Other fracture of base of skull, initial encounter for closed fracture: Secondary | ICD-10-CM | POA: Diagnosis not present

## 2018-12-28 DIAGNOSIS — H90A31 Mixed conductive and sensorineural hearing loss, unilateral, right ear with restricted hearing on the contralateral side: Secondary | ICD-10-CM | POA: Diagnosis not present

## 2018-12-31 ENCOUNTER — Ambulatory Visit
Admission: RE | Admit: 2018-12-31 | Discharge: 2018-12-31 | Disposition: A | Discharge status: Home / Self Care | Payer: Medicare Other | Source: Ambulatory Visit | Attending: Neurosurgery | Admitting: Neurosurgery

## 2018-12-31 DIAGNOSIS — S0990XA Unspecified injury of head, initial encounter: Secondary | ICD-10-CM | POA: Diagnosis not present

## 2018-12-31 DIAGNOSIS — S069X1A Unspecified intracranial injury with loss of consciousness of 30 minutes or less, initial encounter: Secondary | ICD-10-CM

## 2019-01-05 DIAGNOSIS — S069X1A Unspecified intracranial injury with loss of consciousness of 30 minutes or less, initial encounter: Secondary | ICD-10-CM | POA: Diagnosis not present

## 2019-01-05 DIAGNOSIS — H9011 Conductive hearing loss, unilateral, right ear, with unrestricted hearing on the contralateral side: Secondary | ICD-10-CM | POA: Diagnosis not present

## 2019-01-05 DIAGNOSIS — I609 Nontraumatic subarachnoid hemorrhage, unspecified: Secondary | ICD-10-CM | POA: Diagnosis not present

## 2019-01-05 DIAGNOSIS — S0219XG Other fracture of base of skull, subsequent encounter for fracture with delayed healing: Secondary | ICD-10-CM | POA: Diagnosis not present

## 2019-02-22 DIAGNOSIS — Z23 Encounter for immunization: Secondary | ICD-10-CM | POA: Diagnosis not present

## 2019-03-02 DIAGNOSIS — S0219XA Other fracture of base of skull, initial encounter for closed fracture: Secondary | ICD-10-CM | POA: Diagnosis not present

## 2019-03-02 DIAGNOSIS — H9192 Unspecified hearing loss, left ear: Secondary | ICD-10-CM | POA: Diagnosis not present

## 2019-03-02 DIAGNOSIS — H90A31 Mixed conductive and sensorineural hearing loss, unilateral, right ear with restricted hearing on the contralateral side: Secondary | ICD-10-CM | POA: Diagnosis not present

## 2019-03-02 DIAGNOSIS — H6123 Impacted cerumen, bilateral: Secondary | ICD-10-CM | POA: Diagnosis not present

## 2019-03-17 DIAGNOSIS — H2513 Age-related nuclear cataract, bilateral: Secondary | ICD-10-CM | POA: Diagnosis not present

## 2019-03-17 DIAGNOSIS — H43811 Vitreous degeneration, right eye: Secondary | ICD-10-CM | POA: Diagnosis not present

## 2019-03-17 DIAGNOSIS — H04123 Dry eye syndrome of bilateral lacrimal glands: Secondary | ICD-10-CM | POA: Diagnosis not present

## 2019-03-17 DIAGNOSIS — H353131 Nonexudative age-related macular degeneration, bilateral, early dry stage: Secondary | ICD-10-CM | POA: Diagnosis not present

## 2019-04-18 DIAGNOSIS — H90A31 Mixed conductive and sensorineural hearing loss, unilateral, right ear with restricted hearing on the contralateral side: Secondary | ICD-10-CM | POA: Diagnosis not present

## 2019-04-18 DIAGNOSIS — S0219XA Other fracture of base of skull, initial encounter for closed fracture: Secondary | ICD-10-CM | POA: Diagnosis not present

## 2019-04-18 DIAGNOSIS — H9192 Unspecified hearing loss, left ear: Secondary | ICD-10-CM | POA: Diagnosis not present

## 2019-04-21 DIAGNOSIS — H9311 Tinnitus, right ear: Secondary | ICD-10-CM | POA: Diagnosis not present

## 2019-04-21 DIAGNOSIS — S0219XD Other fracture of base of skull, subsequent encounter for fracture with routine healing: Secondary | ICD-10-CM | POA: Diagnosis not present

## 2019-04-21 DIAGNOSIS — H9071 Mixed conductive and sensorineural hearing loss, unilateral, right ear, with unrestricted hearing on the contralateral side: Secondary | ICD-10-CM | POA: Diagnosis not present

## 2019-04-21 DIAGNOSIS — Z881 Allergy status to other antibiotic agents status: Secondary | ICD-10-CM | POA: Diagnosis not present

## 2019-04-23 DIAGNOSIS — Z03818 Encounter for observation for suspected exposure to other biological agents ruled out: Secondary | ICD-10-CM | POA: Diagnosis not present

## 2019-05-05 ENCOUNTER — Other Ambulatory Visit: Payer: Self-pay

## 2019-05-09 ENCOUNTER — Other Ambulatory Visit: Payer: Self-pay

## 2019-05-09 ENCOUNTER — Encounter: Payer: Self-pay | Admitting: Obstetrics & Gynecology

## 2019-05-09 ENCOUNTER — Ambulatory Visit (INDEPENDENT_AMBULATORY_CARE_PROVIDER_SITE_OTHER): Payer: Medicare Other | Admitting: Obstetrics & Gynecology

## 2019-05-09 VITALS — BP 112/70 | Ht 61.75 in | Wt 139.0 lb

## 2019-05-09 DIAGNOSIS — N952 Postmenopausal atrophic vaginitis: Secondary | ICD-10-CM

## 2019-05-09 DIAGNOSIS — Z124 Encounter for screening for malignant neoplasm of cervix: Secondary | ICD-10-CM | POA: Diagnosis not present

## 2019-05-09 DIAGNOSIS — Z01419 Encounter for gynecological examination (general) (routine) without abnormal findings: Secondary | ICD-10-CM | POA: Diagnosis not present

## 2019-05-09 DIAGNOSIS — M8589 Other specified disorders of bone density and structure, multiple sites: Secondary | ICD-10-CM

## 2019-05-09 DIAGNOSIS — Z780 Asymptomatic menopausal state: Secondary | ICD-10-CM

## 2019-05-09 DIAGNOSIS — Z9189 Other specified personal risk factors, not elsewhere classified: Secondary | ICD-10-CM

## 2019-05-09 MED ORDER — ESTRING 2 MG VA RING: 2.0000 mg | VAGINAL_RING | VAGINAL | 4 refills | Status: DC

## 2019-05-09 NOTE — Patient Instructions (Signed)
1. Encounter for routine gynecological examination with Papanicolaou smear of cervix Normal gynecologic exam.  Pap reflex done.  Breast exam normal.  Screening mammogram July 2020 was negative.  Colonoscopy in 2012.  Health labs with family physician.  Good body mass index at 25.63.  Continue with fitness and healthy nutrition.  2. Postmenopause Well on no hormone replacement therapy.  No postmenopausal bleeding.  3. Post-menopausal atrophic vaginitis Doing well with Estring ring.  No contraindication to continue.  Prescription sent to pharmacy.  4. Osteopenia of multiple sites Last bone density in September 2017 showed osteopenia with the lowest T score at -2.3 at the left femoral neck.  Recommend to continue with vitamin D supplements, calcium intake of 1200 mg daily and regular weightbearing physical activities.  Will schedule a bone density here now. - DG Bone Density; Future  5. Screening for malignant neoplasm of cervix - Pap IG w/ reflex to HPV when ASC-U  Other orders - ESTRING 2 MG vaginal ring; Place 2 mg vaginally every 3 (three) months.  Sharon Fox, it was a pleasure seeing you today!  I will inform you of your results as soon as they are available.

## 2019-05-09 NOTE — Progress Notes (Signed)
Sharon Fox [birth year] [id number]   History:    68 y.o. G1P1L1 Married.  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  RP:  New patient presenting for annual gyn exam   HPI: Postmenopausal, well on no hormone replacement therapy.  No postmenopausal bleeding.  No pelvic pain.  No pain with intercourse, using Estring.  Urine and bowel movements normal.  Breasts normal.  Body mass index 25.63.  Walking and gardening.  Health labs with family physician.  Past medical history,surgical history, family history and social history were all reviewed and documented in the EPIC chart.  Gynecologic History No LMP recorded. Patient is postmenopausal. Contraception: post menopausal status Last Pap: 2014. Results were: normal Last mammogram: 12/2018. Results were: Negative Bone Density: 02/2016 Osteopenia Lt Femoral Neck -2.3 Colonoscopy: 2012  Obstetric History OB History  Gravida Para Term Preterm AB Living  1 1 1  0 0 1  SAB TAB Ectopic Multiple Live Births  0 0 0   1    # Outcome Date GA Lbr Len/2nd Weight Sex Delivery Anes PTL Lv  1 Term 5    F CS-Unspec   LIV     ROS: A ROS was performed and pertinent positives and negatives are included in the history.  GENERAL: No fevers or chills. HEENT: No change in vision, no earache, sore throat or sinus congestion. NECK: No pain or stiffness. CARDIOVASCULAR: No chest pain or pressure. No palpitations. PULMONARY: No shortness of breath, cough or wheeze. GASTROINTESTINAL: No abdominal pain, nausea, vomiting or diarrhea, melena or bright red blood per rectum. GENITOURINARY: No urinary frequency, urgency, hesitancy or dysuria. MUSCULOSKELETAL: No joint or muscle pain, no back pain, no recent trauma. DERMATOLOGIC: No rash, no itching, no lesions. ENDOCRINE: No polyuria, polydipsia, no heat or cold intolerance. No recent change in weight. HEMATOLOGICAL: No anemia or easy bruising or bleeding. NEUROLOGIC: No headache, seizures,  numbness, tingling or weakness. PSYCHIATRIC: No depression, no loss of interest in normal activity or change in sleep pattern.     Exam:   BP 112/70   Ht 5' 1.75" (1.568 m)   Wt 139 lb (63 kg)   BMI 25.63 kg/m   Body mass index is 25.63 kg/m.  General appearance : Well developed well nourished female. No acute distress HEENT: Eyes: no retinal hemorrhage or exudates,  Neck supple, trachea midline, no carotid bruits, no thyroidmegaly Lungs: Clear to auscultation, no rhonchi or wheezes, or rib retractions  Heart: Regular rate and rhythm, no murmurs or gallops Breast:Examined in sitting and supine position were symmetrical in appearance, no palpable masses or tenderness,  no skin retraction, no nipple inversion, no nipple discharge, no skin discoloration, no axillary or supraclavicular lymphadenopathy Abdomen: no palpable masses or tenderness, no rebound or guarding Extremities: no edema or skin discoloration or tenderness  Pelvic: Vulva: Normal             Vagina: No gross lesions or discharge  Cervix: No gross lesions or discharge.  Pap reflex done.  Uterus  AV, normal size, shape and consistency, non-tender and mobile  Adnexa  Without masses or tenderness  Anus: Normal   Assessment/Plan:  68 y.o. female for annual exam   1. Encounter for routine gynecological examination with Papanicolaou smear of cervix Normal gynecologic exam.  Pap reflex done.  Breast exam normal.  Screening mammogram July 2020 was negative.  Colonoscopy in 2012.  Health labs with family physician.  Good body mass index at 25.63.  Continue with fitness and  healthy nutrition.  2. Postmenopause Well on no hormone replacement therapy.  No postmenopausal bleeding.  3. Post-menopausal atrophic vaginitis Doing well with Estring ring.  No contraindication to continue.  Prescription sent to pharmacy.  4. Osteopenia of multiple sites Last bone density in September 2017 showed osteopenia with the lowest T score at  -2.3 at the left femoral neck.  Recommend to continue with vitamin D supplements, calcium intake of 1200 mg daily and regular weightbearing physical activities.  Will schedule a bone density here now. - DG Bone Density; Future  5. Screening for malignant neoplasm of cervix - Pap IG w/ reflex to HPV when ASC-U  Other orders - ESTRING 2 MG vaginal ring; Place 2 mg vaginally every 3 (three) months.  [name] MD, 12:25 PM 05/09/2019

## 2019-05-10 LAB — PAP IG W/ RFLX HPV ASCU

## 2019-05-16 DIAGNOSIS — H2513 Age-related nuclear cataract, bilateral: Secondary | ICD-10-CM | POA: Diagnosis not present

## 2019-05-16 DIAGNOSIS — H04123 Dry eye syndrome of bilateral lacrimal glands: Secondary | ICD-10-CM | POA: Diagnosis not present

## 2019-05-16 DIAGNOSIS — H353131 Nonexudative age-related macular degeneration, bilateral, early dry stage: Secondary | ICD-10-CM | POA: Diagnosis not present

## 2019-05-16 DIAGNOSIS — H43811 Vitreous degeneration, right eye: Secondary | ICD-10-CM | POA: Diagnosis not present

## 2019-05-17 ENCOUNTER — Other Ambulatory Visit: Payer: Self-pay | Admitting: Obstetrics & Gynecology

## 2019-05-17 ENCOUNTER — Other Ambulatory Visit: Payer: Self-pay

## 2019-05-17 ENCOUNTER — Ambulatory Visit (INDEPENDENT_AMBULATORY_CARE_PROVIDER_SITE_OTHER): Payer: Medicare Other

## 2019-05-17 DIAGNOSIS — M810 Age-related osteoporosis without current pathological fracture: Secondary | ICD-10-CM

## 2019-05-17 DIAGNOSIS — Z780 Asymptomatic menopausal state: Secondary | ICD-10-CM

## 2019-05-17 DIAGNOSIS — M8589 Other specified disorders of bone density and structure, multiple sites: Secondary | ICD-10-CM

## 2019-05-20 ENCOUNTER — Encounter: Payer: Self-pay | Admitting: *Deleted

## 2019-05-24 DIAGNOSIS — Z20828 Contact with and (suspected) exposure to other viral communicable diseases: Secondary | ICD-10-CM | POA: Diagnosis not present

## 2019-06-03 HISTORY — PX: OTHER SURGICAL HISTORY: SHX169

## 2019-06-23 ENCOUNTER — Other Ambulatory Visit: Payer: Self-pay

## 2019-06-24 ENCOUNTER — Encounter: Payer: Self-pay | Admitting: Obstetrics & Gynecology

## 2019-06-24 ENCOUNTER — Ambulatory Visit (INDEPENDENT_AMBULATORY_CARE_PROVIDER_SITE_OTHER): Payer: Medicare Other | Admitting: Obstetrics & Gynecology

## 2019-06-24 VITALS — BP 128/80

## 2019-06-24 DIAGNOSIS — M810 Age-related osteoporosis without current pathological fracture: Secondary | ICD-10-CM

## 2019-06-24 NOTE — Progress Notes (Signed)
    Sharon Fox [birth year] [id number]        69 y.o.  G1P1001   RP: Osteoporosis for counseling and management  HPI: Postmenopausal on no systemic hormone replacement therapy, using Estring vaginally.  Taking vitamin D supplements, but sometimes forgets.  Enjoys walking.  Recent fall without fracture, but significant head trauma.   OB History  Gravida Para Term Preterm AB Living  1 1 1  0 0 1  SAB TAB Ectopic Multiple Live Births  0 0 0   1    # Outcome Date GA Lbr Len/2nd Weight Sex Delivery Anes PTL Lv  1 Term 27    F CS-Unspec   LIV    Past medical history,surgical history, problem list, medications, allergies, family history and social history were all reviewed and documented in the EPIC chart.   Directed ROS with pertinent positives and negatives documented in the history of present illness/assessment and plan.  Exam:  Vitals:   06/24/19 1225  BP: 128/80   General appearance:  Normal  Bone Density 05/17/2019:  Osteoporosis at bilateral Femoral Necks.  Lt Femoral Neck T-Score -2.6 and Rt Femoral Neck T-Score -2.5.   Assessment/Plan:  69 y.o. G1P1001   1. Age-related osteoporosis without current pathological fracture Osteoporosis at the bilateral femoral necks.  T score is at -2.6 on the left and -2.5 on the right.  Patient has a hard time swallowing tablets and tends to have reflux.  Therefore declines biphosphonate's.  Decision to start on Prolia.  Usage, risks and benefits thoroughly reviewed.  We will do a calcium level today and verify vitamin D level.  Patient will optimize vitamin D supplements, take a total calcium of 1200 mg to 1500 mg daily and increase weightbearing physical activities.  We will reassess by bone density in 2 years. - Calcium - VITAMIN D 25 Hydroxy (Vit-D Deficiency, Fractures)  [name] MD, 12:52 PM 06/24/2019

## 2019-06-24 NOTE — Patient Instructions (Signed)
1. Age-related osteoporosis without current pathological fracture Osteoporosis at the bilateral femoral necks.  T score is at -2.6 on the left and -2.5 on the right.  Patient has a hard time swallowing tablets and tends to have reflux.  Therefore declines biphosphonate's.  Decision to start on Prolia.  Usage, risks and benefits thoroughly reviewed.  We will do a calcium level today and verify vitamin D level.  Patient will optimize vitamin D supplements, take a total calcium of 1200 mg to 1500 mg daily and increase weightbearing physical activities.  We will reassess by bone density in 2 years. - Calcium - VITAMIN D 25 Hydroxy (Vit-D Deficiency, Fractures)  Kleigh, it was a pleasure seeing you today!  I will inform you of your results as soon as they are available.

## 2019-06-27 ENCOUNTER — Telehealth: Payer: Self-pay | Admitting: *Deleted

## 2019-06-27 NOTE — Telephone Encounter (Signed)
Prolia insurance verification has been sent awaiting Summary of benefits  

## 2019-06-27 NOTE — Telephone Encounter (Signed)
-----   Message from [name], MD sent at 06/24/2019 12:56 PM EST ----- Regarding: Prolia Osteoporosis.  Had a fall recently.  Difficulty swallowing pills/risk of reflux.  Start Prolia.  Ca++ and Vit D levels done today.

## 2019-07-01 DIAGNOSIS — S0219XD Other fracture of base of skull, subsequent encounter for fracture with routine healing: Secondary | ICD-10-CM | POA: Diagnosis not present

## 2019-07-01 DIAGNOSIS — H7420 Discontinuity and dislocation of ear ossicles, unspecified ear: Secondary | ICD-10-CM | POA: Diagnosis not present

## 2019-07-01 DIAGNOSIS — H9311 Tinnitus, right ear: Secondary | ICD-10-CM | POA: Diagnosis not present

## 2019-07-01 DIAGNOSIS — H9071 Mixed conductive and sensorineural hearing loss, unilateral, right ear, with unrestricted hearing on the contralateral side: Secondary | ICD-10-CM | POA: Diagnosis not present

## 2019-07-03 ENCOUNTER — Ambulatory Visit: Payer: Medicare Other

## 2019-07-05 NOTE — Telephone Encounter (Signed)
Deductible $203($0MET)  OOP MAX N/A  Annual exam 05/09/2019  Calcium             Date needs calcium checked last level low  Upcoming dental procedures   Prior Authorization needed NO  Pt estimated Cost  $203 due to ded   Pt states she will be having surgery in March would like to call me back if she decides to have Prolia after surgery. Pt states she will call me back. Will close encounter and wait on pt call    Coverage Details: 0% one dose, 0% admin fee

## 2019-07-08 ENCOUNTER — Ambulatory Visit: Payer: Medicare Other

## 2019-07-10 ENCOUNTER — Ambulatory Visit: Payer: Medicare Other

## 2019-07-28 DIAGNOSIS — S069X0A Unspecified intracranial injury without loss of consciousness, initial encounter: Secondary | ICD-10-CM | POA: Diagnosis not present

## 2019-07-28 DIAGNOSIS — G4700 Insomnia, unspecified: Secondary | ICD-10-CM | POA: Diagnosis not present

## 2019-07-28 DIAGNOSIS — H9319 Tinnitus, unspecified ear: Secondary | ICD-10-CM | POA: Diagnosis not present

## 2019-07-28 DIAGNOSIS — I629 Nontraumatic intracranial hemorrhage, unspecified: Secondary | ICD-10-CM | POA: Diagnosis not present

## 2019-07-28 DIAGNOSIS — E559 Vitamin D deficiency, unspecified: Secondary | ICD-10-CM | POA: Diagnosis not present

## 2019-07-28 DIAGNOSIS — E782 Mixed hyperlipidemia: Secondary | ICD-10-CM | POA: Diagnosis not present

## 2019-07-28 DIAGNOSIS — Z0001 Encounter for general adult medical examination with abnormal findings: Secondary | ICD-10-CM | POA: Diagnosis not present

## 2019-07-28 DIAGNOSIS — Z01812 Encounter for preprocedural laboratory examination: Secondary | ICD-10-CM | POA: Diagnosis not present

## 2019-07-28 DIAGNOSIS — Z20822 Contact with and (suspected) exposure to covid-19: Secondary | ICD-10-CM | POA: Diagnosis not present

## 2019-07-28 DIAGNOSIS — K589 Irritable bowel syndrome without diarrhea: Secondary | ICD-10-CM | POA: Diagnosis not present

## 2019-07-28 DIAGNOSIS — K5790 Diverticulosis of intestine, part unspecified, without perforation or abscess without bleeding: Secondary | ICD-10-CM | POA: Diagnosis not present

## 2019-07-28 DIAGNOSIS — Z79899 Other long term (current) drug therapy: Secondary | ICD-10-CM | POA: Diagnosis not present

## 2019-07-28 DIAGNOSIS — Z1389 Encounter for screening for other disorder: Secondary | ICD-10-CM | POA: Diagnosis not present

## 2019-08-03 DIAGNOSIS — H7421 Discontinuity and dislocation of right ear ossicles: Secondary | ICD-10-CM | POA: Diagnosis not present

## 2019-08-03 DIAGNOSIS — Z8781 Personal history of (healed) traumatic fracture: Secondary | ICD-10-CM | POA: Diagnosis not present

## 2019-08-03 DIAGNOSIS — H9011 Conductive hearing loss, unilateral, right ear, with unrestricted hearing on the contralateral side: Secondary | ICD-10-CM | POA: Diagnosis not present

## 2019-08-03 DIAGNOSIS — H902 Conductive hearing loss, unspecified: Secondary | ICD-10-CM | POA: Diagnosis not present

## 2019-08-03 DIAGNOSIS — H9311 Tinnitus, right ear: Secondary | ICD-10-CM | POA: Diagnosis not present

## 2019-08-03 DIAGNOSIS — H748X1 Other specified disorders of right middle ear and mastoid: Secondary | ICD-10-CM | POA: Diagnosis not present

## 2019-08-03 DIAGNOSIS — H74391 Other acquired abnormalities of right ear ossicles: Secondary | ICD-10-CM | POA: Diagnosis not present

## 2019-08-11 DIAGNOSIS — H7420 Discontinuity and dislocation of ear ossicles, unspecified ear: Secondary | ICD-10-CM | POA: Diagnosis not present

## 2019-08-11 DIAGNOSIS — Z9889 Other specified postprocedural states: Secondary | ICD-10-CM | POA: Diagnosis not present

## 2019-08-11 DIAGNOSIS — H9311 Tinnitus, right ear: Secondary | ICD-10-CM | POA: Diagnosis not present

## 2019-08-29 DIAGNOSIS — Z03818 Encounter for observation for suspected exposure to other biological agents ruled out: Secondary | ICD-10-CM | POA: Diagnosis not present

## 2019-08-29 DIAGNOSIS — Z20828 Contact with and (suspected) exposure to other viral communicable diseases: Secondary | ICD-10-CM | POA: Diagnosis not present

## 2019-09-07 ENCOUNTER — Telehealth: Payer: Self-pay | Admitting: Internal Medicine

## 2019-09-07 NOTE — Telephone Encounter (Signed)
Pts appt moved up to 09/20/19@3 :40pm, pt aware of appt.

## 2019-09-08 DIAGNOSIS — M6281 Muscle weakness (generalized): Secondary | ICD-10-CM | POA: Diagnosis not present

## 2019-09-08 DIAGNOSIS — M62838 Other muscle spasm: Secondary | ICD-10-CM | POA: Diagnosis not present

## 2019-09-08 DIAGNOSIS — M26621 Arthralgia of right temporomandibular joint: Secondary | ICD-10-CM | POA: Diagnosis not present

## 2019-09-12 DIAGNOSIS — M6281 Muscle weakness (generalized): Secondary | ICD-10-CM | POA: Diagnosis not present

## 2019-09-12 DIAGNOSIS — M62838 Other muscle spasm: Secondary | ICD-10-CM | POA: Diagnosis not present

## 2019-09-12 DIAGNOSIS — M26621 Arthralgia of right temporomandibular joint: Secondary | ICD-10-CM | POA: Diagnosis not present

## 2019-09-12 DIAGNOSIS — M542 Cervicalgia: Secondary | ICD-10-CM | POA: Diagnosis not present

## 2019-09-13 DIAGNOSIS — R5383 Other fatigue: Secondary | ICD-10-CM | POA: Diagnosis not present

## 2019-09-13 DIAGNOSIS — R350 Frequency of micturition: Secondary | ICD-10-CM | POA: Diagnosis not present

## 2019-09-13 DIAGNOSIS — R198 Other specified symptoms and signs involving the digestive system and abdomen: Secondary | ICD-10-CM | POA: Diagnosis not present

## 2019-09-13 DIAGNOSIS — R61 Generalized hyperhidrosis: Secondary | ICD-10-CM | POA: Diagnosis not present

## 2019-09-19 DIAGNOSIS — M542 Cervicalgia: Secondary | ICD-10-CM | POA: Diagnosis not present

## 2019-09-19 DIAGNOSIS — M26621 Arthralgia of right temporomandibular joint: Secondary | ICD-10-CM | POA: Diagnosis not present

## 2019-09-19 DIAGNOSIS — M62838 Other muscle spasm: Secondary | ICD-10-CM | POA: Diagnosis not present

## 2019-09-19 DIAGNOSIS — M6281 Muscle weakness (generalized): Secondary | ICD-10-CM | POA: Diagnosis not present

## 2019-09-20 ENCOUNTER — Encounter: Payer: Self-pay | Admitting: Internal Medicine

## 2019-09-20 ENCOUNTER — Ambulatory Visit (INDEPENDENT_AMBULATORY_CARE_PROVIDER_SITE_OTHER): Payer: Medicare Other | Admitting: Internal Medicine

## 2019-09-20 VITALS — Ht 62.0 in | Wt 143.0 lb

## 2019-09-20 DIAGNOSIS — R1032 Left lower quadrant pain: Secondary | ICD-10-CM

## 2019-09-20 DIAGNOSIS — K580 Irritable bowel syndrome with diarrhea: Secondary | ICD-10-CM | POA: Diagnosis not present

## 2019-09-20 NOTE — Patient Instructions (Signed)
If you are age 69 or older, your body mass index should be between 23-30. Your Body mass index is 26.16 kg/m. If this is out of the aforementioned range listed, please consider follow up with your Primary Care Provider.  If you are age 82 or younger, your body mass index should be between 19-25. Your Body mass index is 26.16 kg/m. If this is out of the aformentioned range listed, please consider follow up with your Primary Care Provider.   Follow up as needed.  Thank you for choosing me and [facility].  Scarlette Shorts, MD

## 2019-09-20 NOTE — Progress Notes (Signed)
HISTORY OF PRESENT ILLNESS:  Sharon Fox is a pleasant 69 y.o. 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  See that dictation for details.  Previous colonoscopy elsewhere at age 70 and age 69 which were negative except for diverticulosis.  Seen by my partner last year virtually.  See that dictation.  Recently was feeling poorly.  Feverish.  Saw her PCP and was found to have elevated CRP and possible UTI for which she was put on Bactrim twice daily.  Just finishing.  Having some fleeting twinges of left lower quadrant pain.  Rare use of Levsin helps.  Currently with 3 formed bowel movements per day.  No nausea or vomiting.  Some gas.  Significant traumatic fall last year with cranial and cochlear injuries.  Recovering.  I reviewed with her outside blood work from her PCP and urinalysis.  REVIEW OF SYSTEMS:  All non-GI ROS negative unless otherwise stated in the HPI except for hearing problems, night sweats, urinary frequency  Past Medical History:  Diagnosis Date  . Allergy    cipro and seasonal   . Arthritis   . Diverticulosis   . DJD (degenerative joint disease)   . Hyperlipidemia   . IBS (irritable bowel syndrome)   . Osteoarthritis   . Shingles   . Vertigo   . Vitamin D deficiency     Past Surgical History:  Procedure Laterality Date  . CESAREAN SECTION    . COLONOSCOPY     2002, 2012 both negtive (diverticulosis)  . COSMETIC SURGERY     at age 89  . FOOT SURGERY     left foot -   . RHINOPLASTY      Social History Sharon Fox  reports that she has never smoked. She has never used smokeless tobacco. She reports current alcohol use. She reports that she does not use drugs.  family history includes Breast cancer in her maternal grandmother, paternal grandmother, and sister; Cancer in her father and maternal grandmother; Heart attack in her paternal  grandfather.  Allergies  Allergen Reactions  . Ciprofloxacin Rash  . Ciprofloxacin Rash       PHYSICAL EXAMINATION: Vital signs: Ht 5\' 2"  (1.575 m)   Wt 143 lb (64.9 kg)   BMI 26.16 kg/m   Constitutional: generally well-appearing, no acute distress Psychiatric: alert and oriented x3, cooperative Eyes: extraocular movements intact, anicteric, conjunctiva pink Mouth: oral pharynx moist, no lesions Neck: supple no lymphadenopathy Cardiovascular: heart regular rate and rhythm, no murmur Lungs: clear to auscultation bilaterally Abdomen: soft, mild left lower quadrant tenderness, nondistended, no obvious ascites, no peritoneal signs, normal bowel sounds, no organomegaly Rectal: Omitted Extremities: no clubbing, cyanosis, or lower extremity edema bilaterally Skin: no lesions on visible extremities Neuro: No focal deficits.  Cranial nerves intact  ASSESSMENT:  1.  Intermittent problems with diarrhea and urgency.  Historically related to stress.  Previous testing for celiac negative.  Prior colonoscopy was unremarkable. 2.  Intermittent fleeting left lower quadrant pain likely secondary to diverticular spasm. 3.  Possible recent UTI.  Been treated with Bactrim   PLAN:  1.  Resume probiotic. 2.  Complete course of antibiotics 3.  Levsin as needed 4.  Contact the office for problems or questions. 5.  Okay to proceed with her routine screening colonoscopy anytime.  She will let us know.  Formal anniversary date August 2022 A total time of 30 minutes  was spent preparing to see the patient, reviewing test results, obtaining history, performing comprehensive physical exam, counseling the patient regarding the above listed issues, reviewing recommended medical therapies, and documenting clinical information in her health record

## 2019-09-22 DIAGNOSIS — R350 Frequency of micturition: Secondary | ICD-10-CM | POA: Diagnosis not present

## 2019-09-22 DIAGNOSIS — K5790 Diverticulosis of intestine, part unspecified, without perforation or abscess without bleeding: Secondary | ICD-10-CM | POA: Diagnosis not present

## 2019-09-22 DIAGNOSIS — R700 Elevated erythrocyte sedimentation rate: Secondary | ICD-10-CM | POA: Diagnosis not present

## 2019-09-22 DIAGNOSIS — E559 Vitamin D deficiency, unspecified: Secondary | ICD-10-CM | POA: Diagnosis not present

## 2019-09-22 DIAGNOSIS — N390 Urinary tract infection, site not specified: Secondary | ICD-10-CM | POA: Diagnosis not present

## 2019-09-22 DIAGNOSIS — R61 Generalized hyperhidrosis: Secondary | ICD-10-CM | POA: Diagnosis not present

## 2019-09-22 DIAGNOSIS — I629 Nontraumatic intracranial hemorrhage, unspecified: Secondary | ICD-10-CM | POA: Diagnosis not present

## 2019-09-22 DIAGNOSIS — R198 Other specified symptoms and signs involving the digestive system and abdomen: Secondary | ICD-10-CM | POA: Diagnosis not present

## 2019-09-22 DIAGNOSIS — R5383 Other fatigue: Secondary | ICD-10-CM | POA: Diagnosis not present

## 2019-09-22 DIAGNOSIS — R7982 Elevated C-reactive protein (CRP): Secondary | ICD-10-CM | POA: Diagnosis not present

## 2019-09-29 DIAGNOSIS — H9311 Tinnitus, right ear: Secondary | ICD-10-CM | POA: Diagnosis not present

## 2019-09-29 DIAGNOSIS — S0219XS Other fracture of base of skull, sequela: Secondary | ICD-10-CM | POA: Diagnosis not present

## 2019-09-29 DIAGNOSIS — H7421 Discontinuity and dislocation of right ear ossicles: Secondary | ICD-10-CM | POA: Diagnosis not present

## 2019-09-29 DIAGNOSIS — Z9889 Other specified postprocedural states: Secondary | ICD-10-CM | POA: Diagnosis not present

## 2019-09-29 DIAGNOSIS — H9071 Mixed conductive and sensorineural hearing loss, unilateral, right ear, with unrestricted hearing on the contralateral side: Secondary | ICD-10-CM | POA: Diagnosis not present

## 2019-10-03 DIAGNOSIS — M62838 Other muscle spasm: Secondary | ICD-10-CM | POA: Diagnosis not present

## 2019-10-03 DIAGNOSIS — M6281 Muscle weakness (generalized): Secondary | ICD-10-CM | POA: Diagnosis not present

## 2019-10-03 DIAGNOSIS — M542 Cervicalgia: Secondary | ICD-10-CM | POA: Diagnosis not present

## 2019-10-05 ENCOUNTER — Ambulatory Visit: Payer: Medicare Other | Admitting: Internal Medicine

## 2019-10-10 DIAGNOSIS — M542 Cervicalgia: Secondary | ICD-10-CM | POA: Diagnosis not present

## 2019-10-10 DIAGNOSIS — M26621 Arthralgia of right temporomandibular joint: Secondary | ICD-10-CM | POA: Diagnosis not present

## 2019-10-10 DIAGNOSIS — M62838 Other muscle spasm: Secondary | ICD-10-CM | POA: Diagnosis not present

## 2019-10-10 DIAGNOSIS — M6281 Muscle weakness (generalized): Secondary | ICD-10-CM | POA: Diagnosis not present

## 2019-10-18 DIAGNOSIS — M6281 Muscle weakness (generalized): Secondary | ICD-10-CM | POA: Diagnosis not present

## 2019-10-18 DIAGNOSIS — M62838 Other muscle spasm: Secondary | ICD-10-CM | POA: Diagnosis not present

## 2019-10-18 DIAGNOSIS — M542 Cervicalgia: Secondary | ICD-10-CM | POA: Diagnosis not present

## 2019-10-18 DIAGNOSIS — M26621 Arthralgia of right temporomandibular joint: Secondary | ICD-10-CM | POA: Diagnosis not present

## 2019-10-25 DIAGNOSIS — M6281 Muscle weakness (generalized): Secondary | ICD-10-CM | POA: Diagnosis not present

## 2019-10-25 DIAGNOSIS — M26621 Arthralgia of right temporomandibular joint: Secondary | ICD-10-CM | POA: Diagnosis not present

## 2019-10-25 DIAGNOSIS — M62838 Other muscle spasm: Secondary | ICD-10-CM | POA: Diagnosis not present

## 2019-10-25 DIAGNOSIS — M542 Cervicalgia: Secondary | ICD-10-CM | POA: Diagnosis not present

## 2019-12-12 ENCOUNTER — Other Ambulatory Visit: Payer: Self-pay

## 2019-12-12 ENCOUNTER — Other Ambulatory Visit: Payer: Medicare Other

## 2019-12-12 DIAGNOSIS — M810 Age-related osteoporosis without current pathological fracture: Secondary | ICD-10-CM | POA: Diagnosis not present

## 2019-12-13 DIAGNOSIS — E559 Vitamin D deficiency, unspecified: Secondary | ICD-10-CM

## 2019-12-13 LAB — VITAMIN D 25 HYDROXY (VIT D DEFICIENCY, FRACTURES): Vit D, 25-Hydroxy: 24 ng/mL — ABNORMAL LOW (ref 30–100)

## 2019-12-13 LAB — CALCIUM: Calcium: 9.8 mg/dL (ref 8.6–10.4)

## 2019-12-13 MED ORDER — VITAMIN D (ERGOCALCIFEROL) 1.25 MG (50000 UNIT) PO CAPS: 50000.0000 [IU] | ORAL_CAPSULE | ORAL | 0 refills | Status: DC

## 2019-12-15 DIAGNOSIS — S46012A Strain of muscle(s) and tendon(s) of the rotator cuff of left shoulder, initial encounter: Secondary | ICD-10-CM | POA: Diagnosis not present

## 2019-12-27 DIAGNOSIS — M25512 Pain in left shoulder: Secondary | ICD-10-CM | POA: Diagnosis not present

## 2019-12-27 DIAGNOSIS — M6281 Muscle weakness (generalized): Secondary | ICD-10-CM | POA: Diagnosis not present

## 2019-12-27 DIAGNOSIS — M25612 Stiffness of left shoulder, not elsewhere classified: Secondary | ICD-10-CM | POA: Diagnosis not present

## 2019-12-27 DIAGNOSIS — S46012D Strain of muscle(s) and tendon(s) of the rotator cuff of left shoulder, subsequent encounter: Secondary | ICD-10-CM | POA: Diagnosis not present

## 2019-12-28 ENCOUNTER — Encounter: Payer: Self-pay | Admitting: Obstetrics & Gynecology

## 2019-12-28 DIAGNOSIS — Z1231 Encounter for screening mammogram for malignant neoplasm of breast: Secondary | ICD-10-CM | POA: Diagnosis not present

## 2020-01-04 DIAGNOSIS — S46012D Strain of muscle(s) and tendon(s) of the rotator cuff of left shoulder, subsequent encounter: Secondary | ICD-10-CM | POA: Diagnosis not present

## 2020-01-04 DIAGNOSIS — M25512 Pain in left shoulder: Secondary | ICD-10-CM | POA: Diagnosis not present

## 2020-01-04 DIAGNOSIS — M6281 Muscle weakness (generalized): Secondary | ICD-10-CM | POA: Diagnosis not present

## 2020-01-04 DIAGNOSIS — M25612 Stiffness of left shoulder, not elsewhere classified: Secondary | ICD-10-CM | POA: Diagnosis not present

## 2020-01-09 DIAGNOSIS — M25512 Pain in left shoulder: Secondary | ICD-10-CM | POA: Diagnosis not present

## 2020-01-09 DIAGNOSIS — M25612 Stiffness of left shoulder, not elsewhere classified: Secondary | ICD-10-CM | POA: Diagnosis not present

## 2020-01-09 DIAGNOSIS — S46012D Strain of muscle(s) and tendon(s) of the rotator cuff of left shoulder, subsequent encounter: Secondary | ICD-10-CM | POA: Diagnosis not present

## 2020-01-09 DIAGNOSIS — M6281 Muscle weakness (generalized): Secondary | ICD-10-CM | POA: Diagnosis not present

## 2020-01-12 DIAGNOSIS — S46012D Strain of muscle(s) and tendon(s) of the rotator cuff of left shoulder, subsequent encounter: Secondary | ICD-10-CM | POA: Diagnosis not present

## 2020-01-12 DIAGNOSIS — M25612 Stiffness of left shoulder, not elsewhere classified: Secondary | ICD-10-CM | POA: Diagnosis not present

## 2020-01-12 DIAGNOSIS — M25512 Pain in left shoulder: Secondary | ICD-10-CM | POA: Diagnosis not present

## 2020-01-12 DIAGNOSIS — M6281 Muscle weakness (generalized): Secondary | ICD-10-CM | POA: Diagnosis not present

## 2020-01-17 DIAGNOSIS — H60311 Diffuse otitis externa, right ear: Secondary | ICD-10-CM | POA: Diagnosis not present

## 2020-01-20 DIAGNOSIS — Z20828 Contact with and (suspected) exposure to other viral communicable diseases: Secondary | ICD-10-CM | POA: Diagnosis not present

## 2020-01-25 DIAGNOSIS — S46012D Strain of muscle(s) and tendon(s) of the rotator cuff of left shoulder, subsequent encounter: Secondary | ICD-10-CM | POA: Diagnosis not present

## 2020-01-25 DIAGNOSIS — M25512 Pain in left shoulder: Secondary | ICD-10-CM | POA: Diagnosis not present

## 2020-01-25 DIAGNOSIS — M6281 Muscle weakness (generalized): Secondary | ICD-10-CM | POA: Diagnosis not present

## 2020-01-25 DIAGNOSIS — M25612 Stiffness of left shoulder, not elsewhere classified: Secondary | ICD-10-CM | POA: Diagnosis not present

## 2020-01-26 DIAGNOSIS — H90A31 Mixed conductive and sensorineural hearing loss, unilateral, right ear with restricted hearing on the contralateral side: Secondary | ICD-10-CM | POA: Diagnosis not present

## 2020-01-26 DIAGNOSIS — H60311 Diffuse otitis externa, right ear: Secondary | ICD-10-CM | POA: Diagnosis not present

## 2020-01-26 DIAGNOSIS — H7420 Discontinuity and dislocation of ear ossicles, unspecified ear: Secondary | ICD-10-CM | POA: Diagnosis not present

## 2020-01-26 DIAGNOSIS — H90A22 Sensorineural hearing loss, unilateral, left ear, with restricted hearing on the contralateral side: Secondary | ICD-10-CM | POA: Diagnosis not present

## 2020-01-26 DIAGNOSIS — H9311 Tinnitus, right ear: Secondary | ICD-10-CM | POA: Diagnosis not present

## 2020-01-26 DIAGNOSIS — Z9889 Other specified postprocedural states: Secondary | ICD-10-CM | POA: Diagnosis not present

## 2020-01-30 DIAGNOSIS — L259 Unspecified contact dermatitis, unspecified cause: Secondary | ICD-10-CM | POA: Diagnosis not present

## 2020-01-30 DIAGNOSIS — B079 Viral wart, unspecified: Secondary | ICD-10-CM | POA: Diagnosis not present

## 2020-01-30 DIAGNOSIS — L720 Epidermal cyst: Secondary | ICD-10-CM | POA: Diagnosis not present

## 2020-01-30 DIAGNOSIS — L918 Other hypertrophic disorders of the skin: Secondary | ICD-10-CM | POA: Diagnosis not present

## 2020-01-31 DIAGNOSIS — M25512 Pain in left shoulder: Secondary | ICD-10-CM | POA: Diagnosis not present

## 2020-02-14 ENCOUNTER — Ambulatory Visit: Payer: Self-pay | Attending: Internal Medicine

## 2020-02-14 DIAGNOSIS — B078 Other viral warts: Secondary | ICD-10-CM | POA: Diagnosis not present

## 2020-02-14 DIAGNOSIS — L720 Epidermal cyst: Secondary | ICD-10-CM | POA: Diagnosis not present

## 2020-02-14 DIAGNOSIS — D225 Melanocytic nevi of trunk: Secondary | ICD-10-CM | POA: Diagnosis not present

## 2020-02-14 DIAGNOSIS — D239 Other benign neoplasm of skin, unspecified: Secondary | ICD-10-CM | POA: Diagnosis not present

## 2020-02-14 DIAGNOSIS — Z23 Encounter for immunization: Secondary | ICD-10-CM

## 2020-02-14 DIAGNOSIS — D2271 Melanocytic nevi of right lower limb, including hip: Secondary | ICD-10-CM | POA: Diagnosis not present

## 2020-02-14 DIAGNOSIS — L578 Other skin changes due to chronic exposure to nonionizing radiation: Secondary | ICD-10-CM | POA: Diagnosis not present

## 2020-02-14 NOTE — Progress Notes (Signed)
   Covid-19 Vaccination Clinic  Name:  Sharon Fox    MRN: [id number] DOB: 1950-06-20  02/14/2020  Sharon Fox was observed post Covid-19 immunization for 15 minutes without incident. She was provided with Vaccine Information Sheet and instruction to access the V-Safe system.   Sharon Fox was instructed to call 911 with any severe reactions post vaccine: Marland Kitchen Difficulty breathing  . Swelling of face and throat  . A fast heartbeat  . A bad rash all over body  . Dizziness and weakness

## 2020-02-27 ENCOUNTER — Other Ambulatory Visit: Payer: Self-pay | Admitting: Obstetrics & Gynecology

## 2020-02-27 DIAGNOSIS — E559 Vitamin D deficiency, unspecified: Secondary | ICD-10-CM

## 2020-03-29 DIAGNOSIS — H9311 Tinnitus, right ear: Secondary | ICD-10-CM | POA: Diagnosis not present

## 2020-03-29 DIAGNOSIS — H9071 Mixed conductive and sensorineural hearing loss, unilateral, right ear, with unrestricted hearing on the contralateral side: Secondary | ICD-10-CM | POA: Diagnosis not present

## 2020-03-29 DIAGNOSIS — H60311 Diffuse otitis externa, right ear: Secondary | ICD-10-CM | POA: Diagnosis not present

## 2020-03-29 DIAGNOSIS — H90A31 Mixed conductive and sensorineural hearing loss, unilateral, right ear with restricted hearing on the contralateral side: Secondary | ICD-10-CM | POA: Diagnosis not present

## 2020-04-03 IMAGING — CT CT HEAD WITHOUT CONTRAST
4 series · 16 of 47 positions shown, 18 images · non-contrast
Comparison: CT head 11/19/2018

CLINICAL DATA: Intracranial hemorrhage from fall

EXAM:
CT HEAD WITHOUT CONTRAST
TECHNIQUE: Contiguous axial images were obtained from the base of the skull
through the vertex without intravenous contrast.

[Series 3: head without · axial · non-contrast · 0.42mm/px · z∈[-126,-11]mm · 7 of 31 slices shown, 9 images]
[im 4/31  brain]
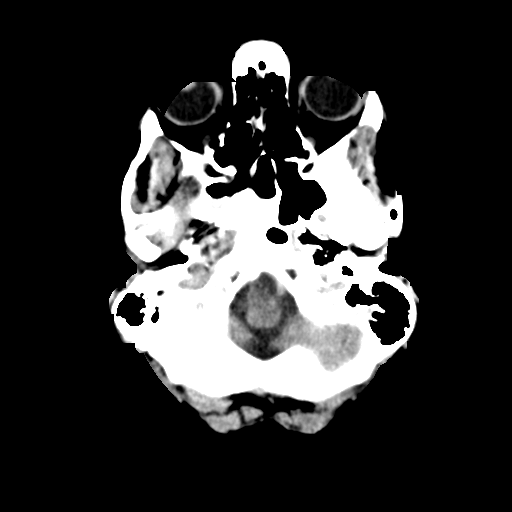
[im 4/31  bone]
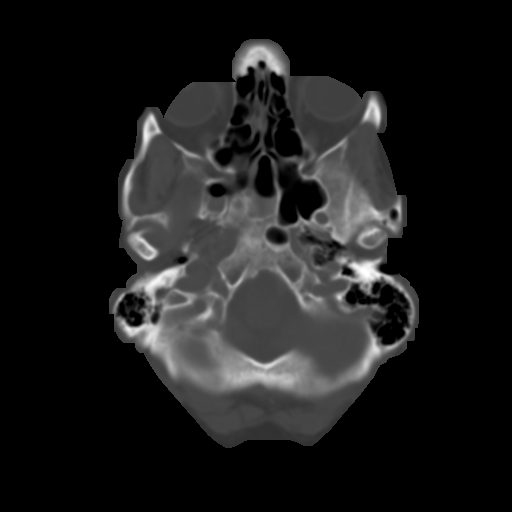
[im 8/31  brain]
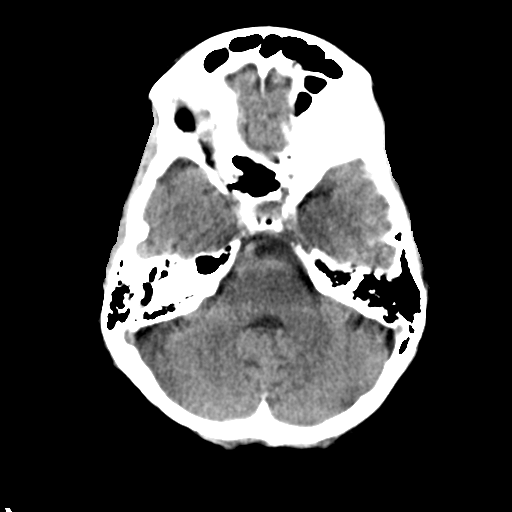
[im 12/31  brain]
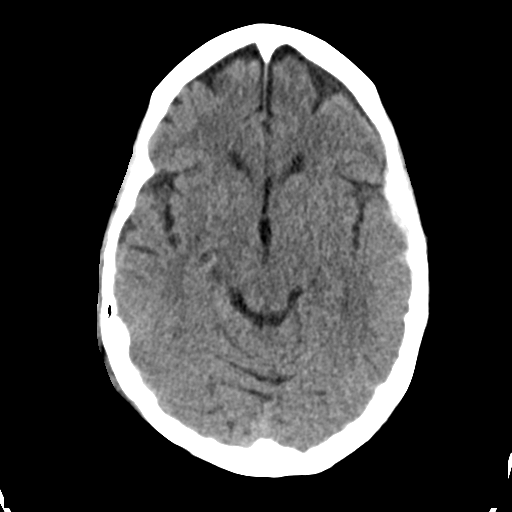
[im 16/31  brain]
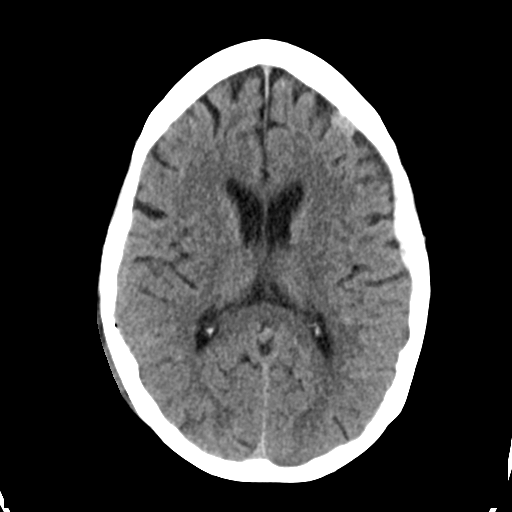
[im 19/31  brain]
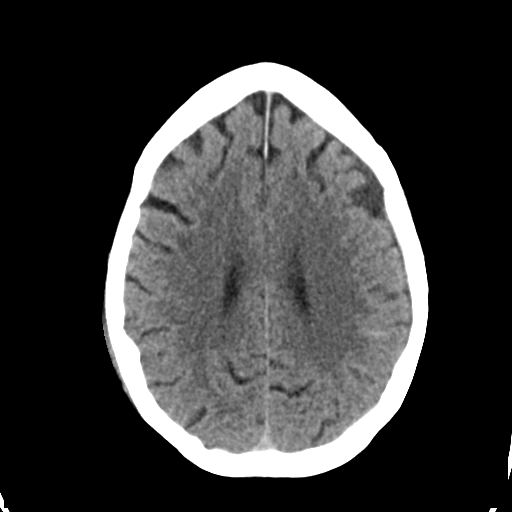
[im 19/31  bone]
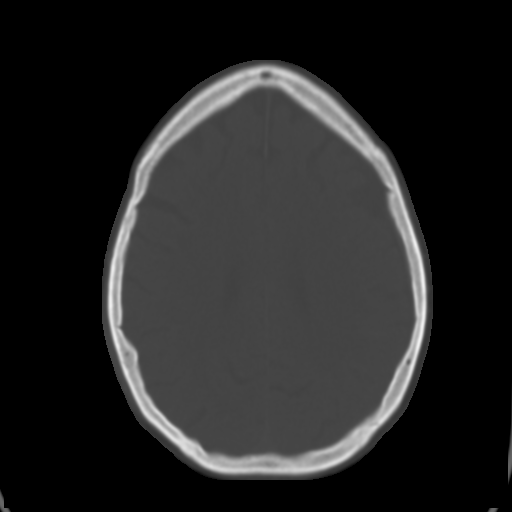
[im 23/31  brain]
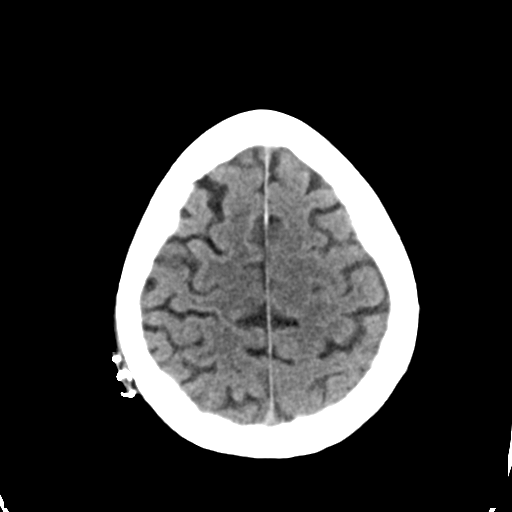
[im 27/31  brain]
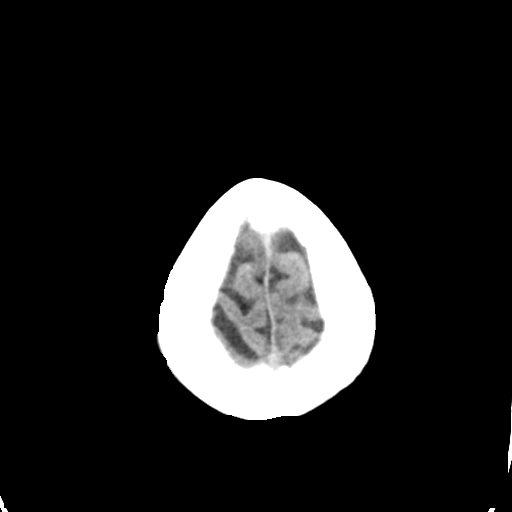

[Series 4: head bone · axial · 0.42mm/px · z∈[-127,-97]mm · 3 of 77 slices shown]
[im 8/77  bone]
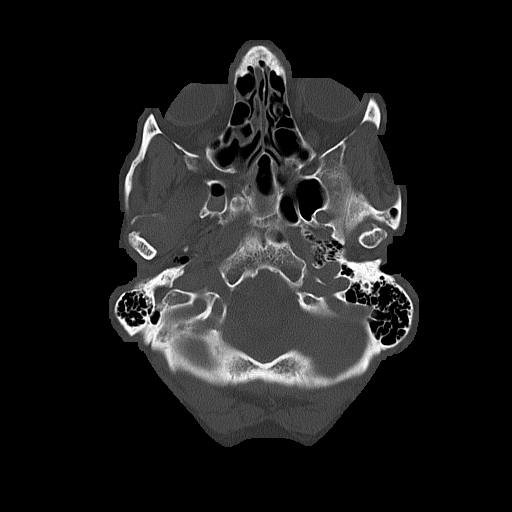
[im 16/77  bone]
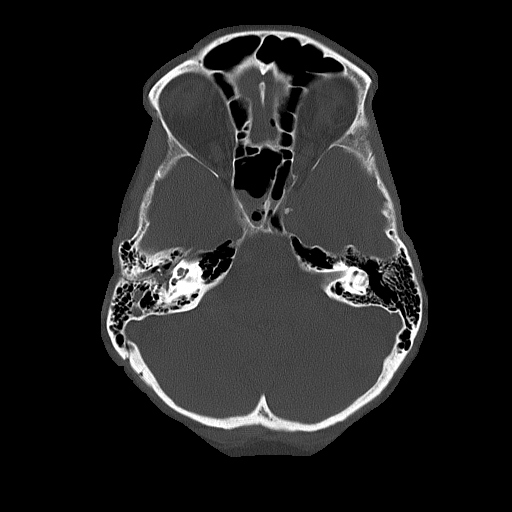
[im 23/77  bone]
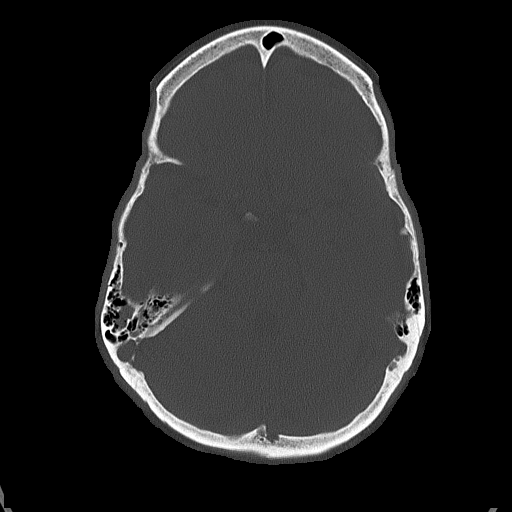

[Series 5: head without cor · coronal · non-contrast · 0.29mm/px · 3 of 67 slices shown]
[im 23/67  brain]
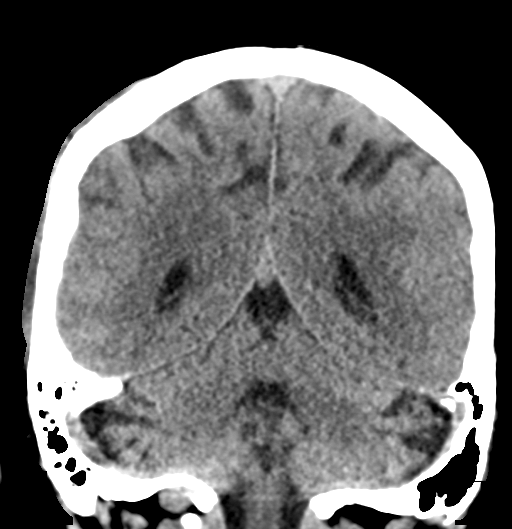
[im 30/67  brain]
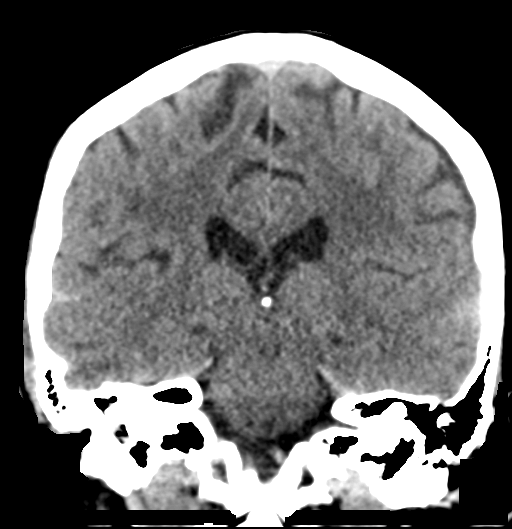
[im 37/67  brain]
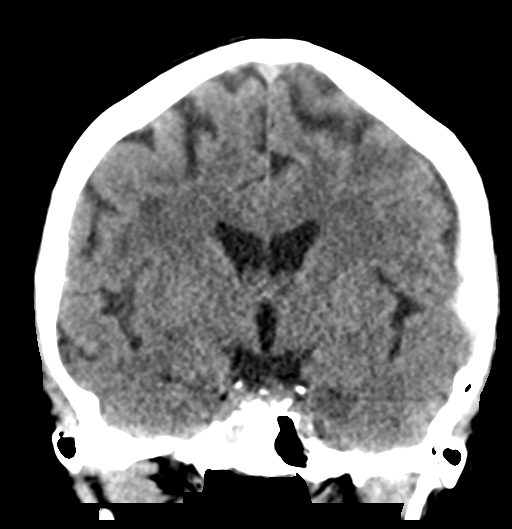

[Series 6: head without sag · sagittal · non-contrast · 0.30mm/px · 3 of 66 slices shown]
[im 22/66  brain]
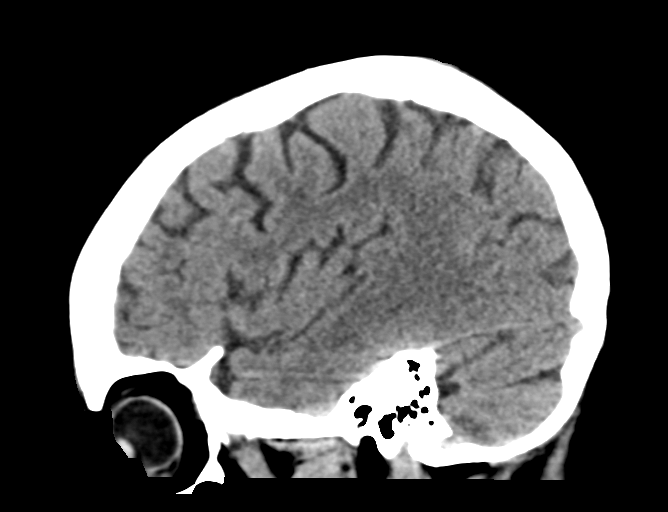
[im 33/66  brain]
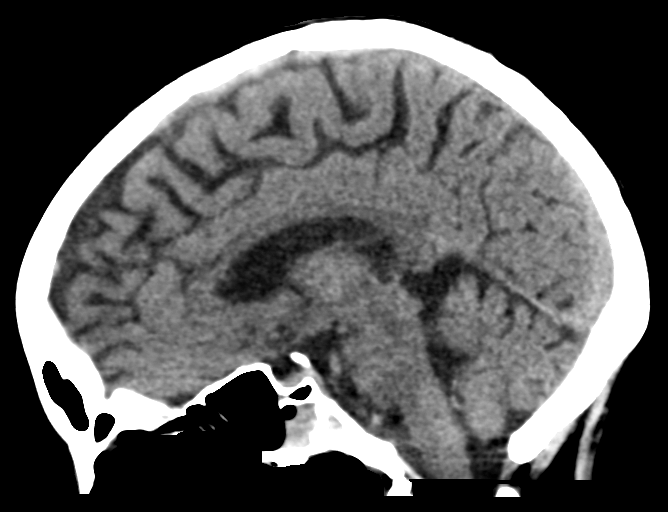
[im 44/66  brain]
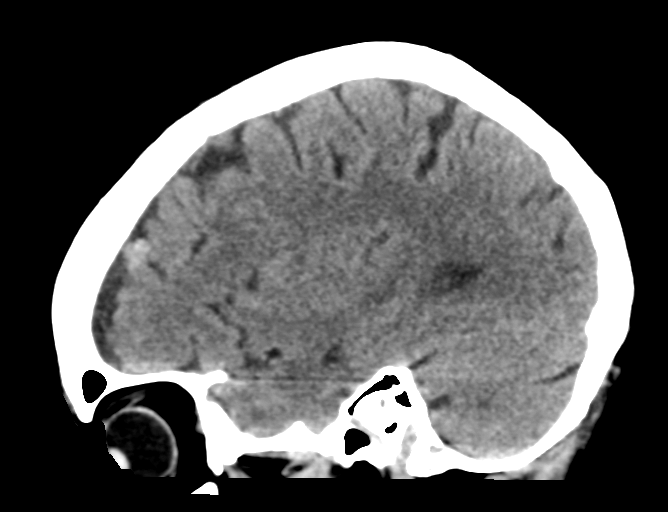

[16 of 47 positions shown; findings below may reference images not displayed]

FINDINGS: Brain: Small parenchymal hemorrhage in the right temporal lobe just
above the mastoid sinus unchanged in size. Mild subarachnoid
hemorrhage in the right temporal lobe unchanged.

Small left temporal subdural hematoma measuring 5 mm, unchanged.
Mild subarachnoid hemorrhage left temporal lobe also unchanged.

Ventricle size normal.  No midline shift.  No acute infarct or mass.

Vascular: Negative for hyperdense vessel

Skull: Fractures of the right mastoid sinus extending into the
sphenoid sinus on the right. Right mastoid effusion and air-fluid
level right sphenoid sinus compatible with blood. Nondepressed
fracture right temporal bone extending into the right parietal bone.

Sinuses/Orbits: Air-fluid level right sphenoid sinus. Right mastoid
effusion. Associated fractures as above. Remainder of the sinuses
clear. Negative orbit

Other: None
IMPRESSION: Stable CT head from yesterday

Right temporal lobe hemorrhagic contusion and right temporal
subarachnoid hemorrhage stable. 5 mm left temporal subdural hematoma
and associated subarachnoid hemorrhage unchanged. No midline shift.

Fracture through the right mastoid sinus into the sphenoid sinus.
Nondepressed right temporal and parietal skull fracture.

## 2020-04-03 IMAGING — CT CT ANGIOGRAPHY HEAD
1 of 9 series · 6 of 33 positions shown · IV contrast (OMNI 350)
Comparison: 11/19/2018 CT of the head and cervical spine.

CLINICAL DATA: 68 y/o  F; posttraumatic headache.

EXAM:
CT ANGIOGRAPHY HEAD AND NECK
TECHNIQUE: Multidetector CT imaging of the head and neck was performed using
the standard protocol during bolus administration of intravenous
contrast. Multiplanar CT image reconstructions and MIPs were
obtained to evaluate the vascular anatomy. Carotid stenosis
measurements (when applicable) are obtained utilizing NASCET
criteria, using the distal internal carotid diameter as the
denominator.
CONTRAST:  100mL OMNIPAQUE IOHEXOL 350 MG/ML SOLN

[Series 6: cta neck thins · axial · 0.50mm/px · z∈[+1004,+1251]mm · 6 of 862 slices shown]
[im 124/862  soft-tissue]
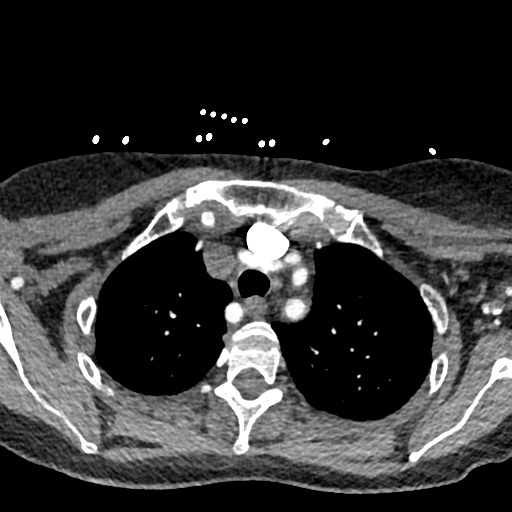
[im 247/862  bone]
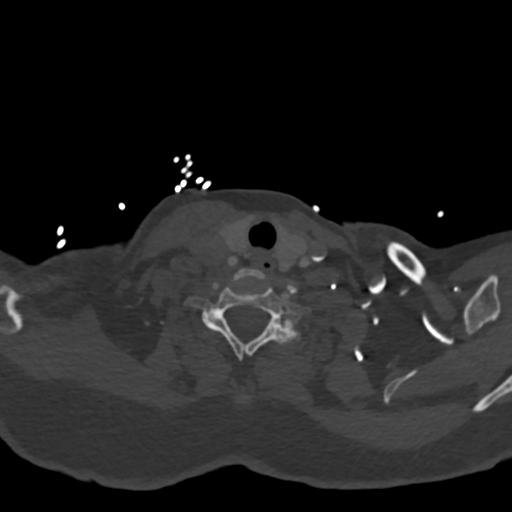
[im 370/862  soft-tissue]
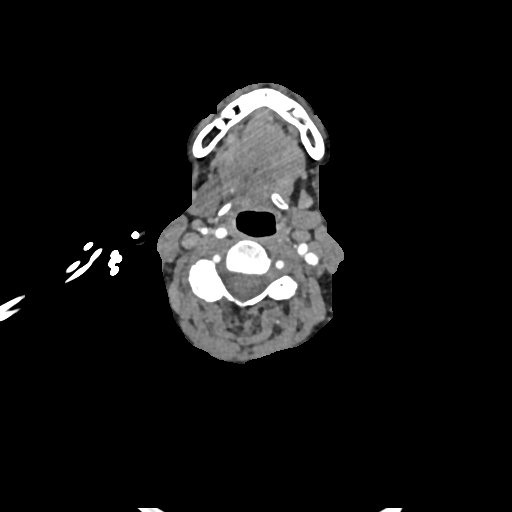
[im 493/862  bone]
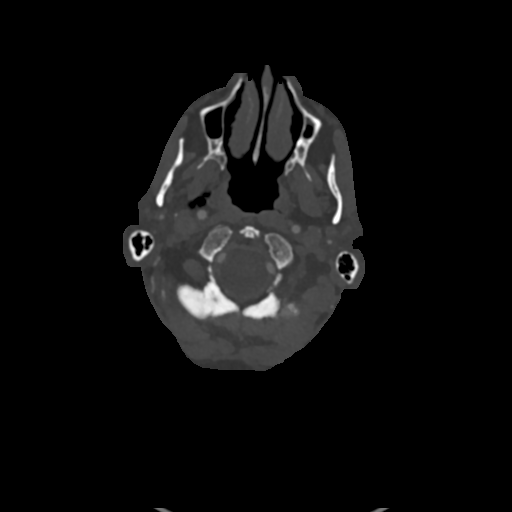
[im 616/862  soft-tissue]
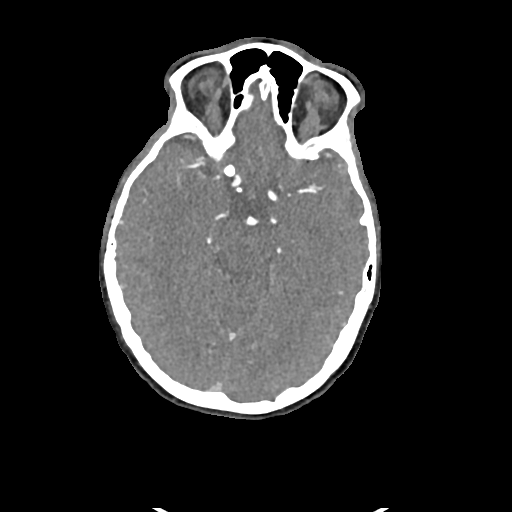
[im 739/862  bone]
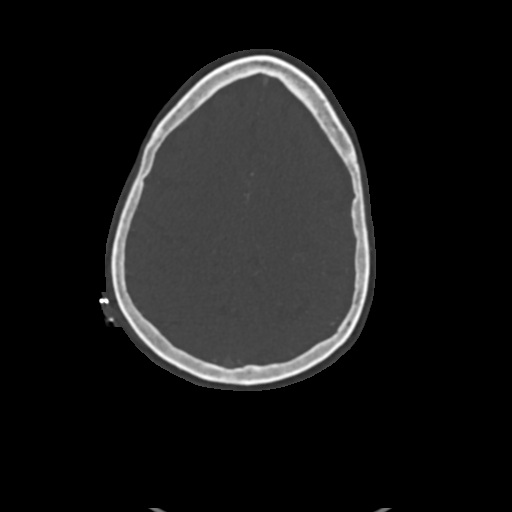

[6 of 33 positions shown; findings below may reference images not displayed]

FINDINGS: CTA NECK FINDINGS

Aortic arch: Aberrant right subclavian artery. Imaged portion shows
no evidence of aneurysm or dissection. No significant stenosis of
the major arch vessel origins.

Right carotid system: No evidence of dissection, stenosis (50% or
greater) or occlusion.

Left carotid system: No evidence of dissection, stenosis (50% or
greater) or occlusion.

Vertebral arteries: Codominant. No evidence of dissection, stenosis
(50% or greater) or occlusion.

Skeleton: No acute fracture cervical spine. Mild cervical
spondylosis with discogenic degenerative changes greatest at the
C5-6 level and multilevel facet arthrosis.

Other neck: Negative.

Upper chest: Negative.

Review of the MIP images confirms the above findings

CTA HEAD FINDINGS

Anterior circulation: No significant stenosis, proximal occlusion,
aneurysm, or vascular malformation. Non stenotic calcific
atherosclerosis of the internal carotid arteries.

Posterior circulation: No significant stenosis, proximal occlusion,
aneurysm, or vascular malformation.

Venous sinuses: As permitted by contrast timing, patent.

Anatomic variants: None significant.

Right temporal bone and skull base fracture and intracranial
hemorrhage as characterized on prior CT of head.

Review of the MIP images confirms the above findings
IMPRESSION: No acute vascular injury identified. Patent carotid and vertebral
arteries of the neck. Patent anterior and posterior intracranial
circulation. No large vessel occlusion, dissection, aneurysm, or
significant stenosis.

## 2020-04-21 DIAGNOSIS — Z23 Encounter for immunization: Secondary | ICD-10-CM | POA: Diagnosis not present

## 2020-06-12 ENCOUNTER — Encounter: Payer: Medicare Other | Admitting: Obstetrics & Gynecology

## 2020-06-13 DIAGNOSIS — Z20822 Contact with and (suspected) exposure to covid-19: Secondary | ICD-10-CM | POA: Diagnosis not present

## 2020-06-15 ENCOUNTER — Other Ambulatory Visit: Payer: Self-pay | Admitting: Obstetrics & Gynecology

## 2020-06-15 NOTE — Telephone Encounter (Signed)
annual exam on 06/28/20

## 2020-06-28 ENCOUNTER — Ambulatory Visit: Payer: Medicare Other | Admitting: Obstetrics & Gynecology

## 2020-07-04 ENCOUNTER — Encounter: Payer: Self-pay | Admitting: Obstetrics & Gynecology

## 2020-07-04 ENCOUNTER — Other Ambulatory Visit: Payer: Self-pay

## 2020-07-04 ENCOUNTER — Ambulatory Visit (INDEPENDENT_AMBULATORY_CARE_PROVIDER_SITE_OTHER): Payer: Medicare Other | Admitting: Obstetrics & Gynecology

## 2020-07-04 VITALS — BP 122/72 | HR 78 | Resp 14 | Ht 62.25 in | Wt 145.5 lb

## 2020-07-04 DIAGNOSIS — Z01419 Encounter for gynecological examination (general) (routine) without abnormal findings: Secondary | ICD-10-CM | POA: Diagnosis not present

## 2020-07-04 DIAGNOSIS — M810 Age-related osteoporosis without current pathological fracture: Secondary | ICD-10-CM

## 2020-07-04 DIAGNOSIS — N952 Postmenopausal atrophic vaginitis: Secondary | ICD-10-CM

## 2020-07-04 DIAGNOSIS — Z780 Asymptomatic menopausal state: Secondary | ICD-10-CM

## 2020-07-04 MED ORDER — ESTRING 2 MG VA RING: 2.0000 mg | VAGINAL_RING | VAGINAL | 4 refills | Status: DC

## 2020-07-04 NOTE — Progress Notes (Signed)
Sharon Fox April 05, 1951 [id number]   History:    70 y.o. G1P1L1 Married.  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  RP:  Established patient presenting for annual gyn exam   HPI: Postmenopausal, well on no hormone replacement therapy.  No postmenopausal bleeding.  No pelvic pain.  No pain with intercourse, using Estring.  Urine and bowel movements normal. Breasts normal.  Body mass index 26.4.  Walking and gardening.  Health labs with family physician.   Past medical history,surgical history, family history and social history were all reviewed and documented in the EPIC chart.  Gynecologic History No LMP recorded. Patient is postmenopausal.  Obstetric History OB History  Gravida Para Term Preterm AB Living  1 1 1  0 0 1  SAB IAB Ectopic Multiple Live Births  0 0 0   1    # Outcome Date GA Lbr Len/2nd Weight Sex Delivery Anes PTL Lv  1 Term 6    F CS-Unspec   LIV     ROS: A ROS was performed and pertinent positives and negatives are included in the history.  GENERAL: No fevers or chills. HEENT: No change in vision, no earache, sore throat or sinus congestion. NECK: No pain or stiffness. CARDIOVASCULAR: No chest pain or pressure. No palpitations. PULMONARY: No shortness of breath, cough or wheeze. GASTROINTESTINAL: No abdominal pain, nausea, vomiting or diarrhea, melena or bright red blood per rectum. GENITOURINARY: No urinary frequency, urgency, hesitancy or dysuria. MUSCULOSKELETAL: No joint or muscle pain, no back pain, no recent trauma. DERMATOLOGIC: No rash, no itching, no lesions. ENDOCRINE: No polyuria, polydipsia, no heat or cold intolerance. No recent change in weight. HEMATOLOGICAL: No anemia or easy bruising or bleeding. NEUROLOGIC: No headache, seizures, numbness, tingling or weakness. PSYCHIATRIC: No depression, no loss of interest in normal activity or change in sleep pattern.     Exam:   BP 122/72 (BP Location: Right Arm, Patient  Position: Sitting, Cuff Size: Normal)   Pulse 78   Resp 14   Ht 5' 2.25" (1.581 m)   Wt 145 lb 8 oz (66 kg)   BMI 26.40 kg/m   Body mass index is 26.4 kg/m.  General appearance : Well developed well nourished female. No acute distress HEENT: Eyes: no retinal hemorrhage or exudates,  Neck supple, trachea midline, no carotid bruits, no thyroidmegaly Lungs: Clear to auscultation, no rhonchi or wheezes, or rib retractions  Heart: Regular rate and rhythm, no murmurs or gallops Breast:Examined in sitting and supine position were symmetrical in appearance, no palpable masses or tenderness,  no skin retraction, no nipple inversion, no nipple discharge, no skin discoloration, no axillary or supraclavicular lymphadenopathy Abdomen: no palpable masses or tenderness, no rebound or guarding Extremities: no edema or skin discoloration or tenderness  Pelvic: Vulva: Normal             Vagina: No gross lesions or discharge  Cervix: No gross lesions or discharge  Uterus  AV, normal size, shape and consistency, non-tender and mobile  Adnexa  Without masses or tenderness  Anus: Normal   Assessment/Plan:  70 y.o. female for annual exam   1. Well female exam with routine gynecological exam Normal gynecologic exam in menopause.  Pap test negative in 2019, no indication to repeat this year.  Breast exam normal.  Screening mammogram July 2021 was negative.  We will repeat her colonoscopy this year.  Health labs with family physician.  Good body mass index at 26.4.  Continue with fitness  and healthy nutrition.  2. Postmenopause Well on no systemic hormone replacement therapy.  No postmenopausal bleeding.  3. Post-menopausal atrophic vaginitis Well on Estring.  No contraindication to continue.  Prescription sent to pharmacy.  4. Age-related osteoporosis without current pathological fracture Osteoporosis with a T score at -2.6 on bone density December 2020.  Optimizing her vitamin D and calcium intake.   Walking regularly.  Not on any medical bone therapy at this time.  Will recheck a bone density now to evaluate stability and decide on the indication of starting or not a bone medical treatment. - DG Bone Density; Future  Other orders - ESTRING 2 MG vaginal ring; Place 2 mg vaginally every 3 (three) months.  [name] MD, 9:10 AM 07/04/2020

## 2020-07-27 DIAGNOSIS — H903 Sensorineural hearing loss, bilateral: Secondary | ICD-10-CM | POA: Diagnosis not present

## 2020-07-27 DIAGNOSIS — H9311 Tinnitus, right ear: Secondary | ICD-10-CM | POA: Diagnosis not present

## 2020-07-27 DIAGNOSIS — Z9889 Other specified postprocedural states: Secondary | ICD-10-CM | POA: Diagnosis not present

## 2020-07-27 DIAGNOSIS — H60311 Diffuse otitis externa, right ear: Secondary | ICD-10-CM | POA: Diagnosis not present

## 2020-07-27 DIAGNOSIS — H9071 Mixed conductive and sensorineural hearing loss, unilateral, right ear, with unrestricted hearing on the contralateral side: Secondary | ICD-10-CM | POA: Diagnosis not present

## 2020-08-15 DIAGNOSIS — K130 Diseases of lips: Secondary | ICD-10-CM | POA: Diagnosis not present

## 2020-08-15 DIAGNOSIS — K5790 Diverticulosis of intestine, part unspecified, without perforation or abscess without bleeding: Secondary | ICD-10-CM | POA: Diagnosis not present

## 2020-08-15 DIAGNOSIS — E559 Vitamin D deficiency, unspecified: Secondary | ICD-10-CM | POA: Diagnosis not present

## 2020-08-15 DIAGNOSIS — R198 Other specified symptoms and signs involving the digestive system and abdomen: Secondary | ICD-10-CM | POA: Diagnosis not present

## 2020-08-15 DIAGNOSIS — I629 Nontraumatic intracranial hemorrhage, unspecified: Secondary | ICD-10-CM | POA: Diagnosis not present

## 2020-08-15 DIAGNOSIS — E782 Mixed hyperlipidemia: Secondary | ICD-10-CM | POA: Diagnosis not present

## 2020-08-15 DIAGNOSIS — Z1211 Encounter for screening for malignant neoplasm of colon: Secondary | ICD-10-CM | POA: Diagnosis not present

## 2020-08-15 DIAGNOSIS — Z0000 Encounter for general adult medical examination without abnormal findings: Secondary | ICD-10-CM | POA: Diagnosis not present

## 2020-08-15 DIAGNOSIS — Z79899 Other long term (current) drug therapy: Secondary | ICD-10-CM | POA: Diagnosis not present

## 2020-08-27 DIAGNOSIS — H353131 Nonexudative age-related macular degeneration, bilateral, early dry stage: Secondary | ICD-10-CM | POA: Diagnosis not present

## 2020-08-27 DIAGNOSIS — H2513 Age-related nuclear cataract, bilateral: Secondary | ICD-10-CM | POA: Diagnosis not present

## 2020-08-27 DIAGNOSIS — H43811 Vitreous degeneration, right eye: Secondary | ICD-10-CM | POA: Diagnosis not present

## 2020-08-27 DIAGNOSIS — H04123 Dry eye syndrome of bilateral lacrimal glands: Secondary | ICD-10-CM | POA: Diagnosis not present

## 2020-08-28 ENCOUNTER — Ambulatory Visit (INDEPENDENT_AMBULATORY_CARE_PROVIDER_SITE_OTHER): Payer: Medicare Other

## 2020-08-28 ENCOUNTER — Other Ambulatory Visit: Payer: Self-pay

## 2020-08-28 ENCOUNTER — Other Ambulatory Visit: Payer: Self-pay | Admitting: Obstetrics & Gynecology

## 2020-08-28 DIAGNOSIS — Z780 Asymptomatic menopausal state: Secondary | ICD-10-CM | POA: Diagnosis not present

## 2020-08-28 DIAGNOSIS — M810 Age-related osteoporosis without current pathological fracture: Secondary | ICD-10-CM

## 2020-09-24 DIAGNOSIS — J301 Allergic rhinitis due to pollen: Secondary | ICD-10-CM | POA: Diagnosis not present

## 2020-11-15 DIAGNOSIS — Z23 Encounter for immunization: Secondary | ICD-10-CM | POA: Diagnosis not present

## 2020-11-19 NOTE — Telephone Encounter (Signed)
Pt called wanting Prolia same day as Annual  I will put on schedule   Deductible $203($0MET)   OOP MAX N/A   Annual exam 05/09/2019   Calcium   9.8          Date  12/12/2019   Upcoming dental procedures   Prior Authorization needed NO   Pt estimated Cost  $203 due to ded        Coverage Details: 0% one dose, 0% admin fee

## 2020-11-20 DIAGNOSIS — H6123 Impacted cerumen, bilateral: Secondary | ICD-10-CM | POA: Diagnosis not present

## 2020-11-20 DIAGNOSIS — H90A31 Mixed conductive and sensorineural hearing loss, unilateral, right ear with restricted hearing on the contralateral side: Secondary | ICD-10-CM | POA: Diagnosis not present

## 2020-11-20 DIAGNOSIS — H9071 Mixed conductive and sensorineural hearing loss, unilateral, right ear, with unrestricted hearing on the contralateral side: Secondary | ICD-10-CM | POA: Diagnosis not present

## 2020-11-22 ENCOUNTER — Other Ambulatory Visit: Payer: Self-pay

## 2020-11-22 ENCOUNTER — Encounter: Payer: Self-pay | Admitting: Obstetrics & Gynecology

## 2020-11-22 ENCOUNTER — Ambulatory Visit (INDEPENDENT_AMBULATORY_CARE_PROVIDER_SITE_OTHER): Payer: Medicare Other | Admitting: Obstetrics & Gynecology

## 2020-11-22 VITALS — BP 134/72

## 2020-11-22 DIAGNOSIS — R300 Dysuria: Secondary | ICD-10-CM | POA: Diagnosis not present

## 2020-11-22 DIAGNOSIS — M810 Age-related osteoporosis without current pathological fracture: Secondary | ICD-10-CM

## 2020-11-22 NOTE — Progress Notes (Signed)
    Sharon Fox 07-12-1950 [id number]        70 y.o.  G1P1001   RP: Counseling on Osteoporosis/Dysuria  HPI: Osteoporosis on BD 08/28/2020.  Taking Vit D/Ca++ and doing weight bearing physical activities.  C/O dysuria. U/A with Fam MD.  Pending Urine culture.  No blood in urine.  No fever.   OB History  Gravida Para Term Preterm AB Living  1 1 1  0 0 1  SAB IAB Ectopic Multiple Live Births  0 0 0   1    # Outcome Date GA Lbr Len/2nd Weight Sex Delivery Anes PTL Lv  1 Term 39    F CS-Unspec   LIV    Past medical history,surgical history, problem list, medications, allergies, family history and social history were all reviewed and documented in the EPIC chart.   Directed ROS with pertinent positives and negatives documented in the history of present illness/assessment and plan.  Exam:  Vitals:   11/22/20 1009  BP: 134/72   General appearance:  Normal  Bone density 08/28/2020:  T-Score -2.6 at the left femoral neck.   Assessment/Plan:  70 y.o. G1P1001   1. Age-related osteoporosis without current pathological fracture Bone Density with Osteoporosis T-Score -2.6 at the Left Femoral Neck and -2.5 at the Right Femoral Neck.  Stable x 05/2019.  Decision to continue with weight bearing physical activities, Vit D supplements and Ca++ 1.5 g/d total and not start on Bone medication at this time. - DG Bone Density; Future  2. Dysuria Will verify U. Culture pending with Fam MD.  Push water intake.   [name] MD, 10:19 AM 11/22/2020

## 2020-11-25 ENCOUNTER — Encounter: Payer: Self-pay | Admitting: Obstetrics & Gynecology

## 2020-11-26 ENCOUNTER — Telehealth: Payer: Self-pay | Admitting: *Deleted

## 2020-11-26 NOTE — Telephone Encounter (Signed)
-----   Message from Bishop Limbo sent at 11/26/2020 11:15 AM EDT ----- This patient just had a dexa scan on 08/28/20. She saw ML on 11/22/20 for results and the dr put in another dexa order for this patient. I don't think she means to have it repeated so close but can you check with her as to when she wants it scheduled?  Thanks for your help!!

## 2020-11-26 NOTE — Telephone Encounter (Signed)
Dr.Lavoie please see the below

## 2020-11-27 ENCOUNTER — Telehealth: Payer: Self-pay | Admitting: *Deleted

## 2020-11-27 NOTE — Telephone Encounter (Signed)
Patient called requesting U/A results from office visit on 11/22/20. U/a notes " needs to be collected" patient said she did leave a u/a. The 1st time it was not enough urine, but she went back and left another u/a specimen with enough urine. I told the patient I was sorry this happened, reports she is leaving in am for a 5 am flight to [place]. Reports she is feeling better and will follow up if needed.

## 2020-11-28 NOTE — Telephone Encounter (Signed)
Dr.Lavoie replied " The Bone Density order is for a repeat Bone Density in 1 year given that we made the decision not to start on bone medication, but rather improve Vit D/Ca++ and weight bearing physical activities. "   Will route to Abigail Butts so she is aware.

## 2020-12-01 DIAGNOSIS — Z20822 Contact with and (suspected) exposure to covid-19: Secondary | ICD-10-CM | POA: Diagnosis not present

## 2020-12-10 DIAGNOSIS — M7582 Other shoulder lesions, left shoulder: Secondary | ICD-10-CM | POA: Diagnosis not present

## 2020-12-11 ENCOUNTER — Ambulatory Visit (INDEPENDENT_AMBULATORY_CARE_PROVIDER_SITE_OTHER): Payer: Medicare Other | Admitting: Internal Medicine

## 2020-12-11 ENCOUNTER — Encounter: Payer: Self-pay | Admitting: Internal Medicine

## 2020-12-11 VITALS — BP 120/78 | HR 79 | Ht 62.0 in | Wt 142.6 lb

## 2020-12-11 DIAGNOSIS — K589 Irritable bowel syndrome without diarrhea: Secondary | ICD-10-CM | POA: Diagnosis not present

## 2020-12-11 DIAGNOSIS — Z1211 Encounter for screening for malignant neoplasm of colon: Secondary | ICD-10-CM

## 2020-12-11 MED ORDER — SUTAB 1479-225-188 MG PO TABS: ORAL_TABLET | ORAL | 0 refills | Status: DC

## 2020-12-11 NOTE — Progress Notes (Signed)
HISTORY OF PRESENT ILLNESS:  Sharon Fox is a 70 y.o. female with a history of irritable bowel syndrome who presents today regarding the need for repeat screening colonoscopy.  Patient was last seen in this office April 2021.  See that dictation.  She tells me that her chronic abdominal complaints are about the same.  She will experience occasional issues with diarrhea.  Imodium helps.  Also intermittent problems with belching and flatulence.  She continues with occasional sharp left lower quadrant pain for which Levsin sublingual was helpful.  She is not needing any particular agents for her bowels.  No bleeding.  Her weight is stable.  Last complete colonoscopy was performed with Dr. Earle Gell August 2012.  The examination was normal except for sigmoid diverticulosis.  Follow-up in 10 years recommended patient denies interval family history of colon cancer.  REVIEW OF SYSTEMS:  All non-GI ROS negative as otherwise stated in the HPI except for pruritus, hearing problems  Past Medical History:  Diagnosis Date   Allergy    cipro and seasonal    Arthritis    Diverticulosis    DJD (degenerative joint disease)    Hyperlipidemia    IBS (irritable bowel syndrome)    Osteoarthritis    Shingles    Vertigo    Vitamin D deficiency     Past Surgical History:  Procedure Laterality Date   CESAREAN SECTION     COLONOSCOPY     2002, 2012 both negtive (diverticulosis)   COSMETIC SURGERY     at age 21   FOOT SURGERY     left foot -    RHINOPLASTY      Social History Sharon Fox  reports that she has never smoked. She has never used smokeless tobacco. She reports current alcohol use. She reports that she does not use drugs.  family history includes Breast cancer in her maternal grandmother, paternal grandmother, and sister; Cancer in her father and maternal grandmother; Heart attack in her paternal grandfather.  Allergies  Allergen Reactions   Ciprofloxacin Rash    Ciprofloxacin Rash       PHYSICAL EXAMINATION: Vital signs: BP 120/78   Pulse 79   Ht 5\' 2"  (1.575 m)   Wt 142 lb 9.6 oz (64.7 kg)   SpO2 100%   BMI 26.08 kg/m   Constitutional: generally well-appearing, no acute distress Psychiatric: alert and oriented x3, cooperative Eyes: extraocular movements intact, anicteric, conjunctiva pink Mouth: oral pharynx moist, no lesions Neck: supple no lymphadenopathy Cardiovascular: heart regular rate and rhythm, no murmur Lungs: clear to auscultation bilaterally Abdomen: soft, nontender, nondistended, no obvious ascites, no peritoneal signs, normal bowel sounds, no organomegaly Rectal: Deferred until colonoscopy Extremities: no clubbing, cyanosis, or lower extremity edema bilaterally Skin: no lesions on visible extremities Neuro: No focal deficits.  Cranial nerves intact  ASSESSMENT:  1.  Irritable bowel syndrome.  Tendency is toward intermittent diarrhea and left lower quadrant discomfort.  Easily managed with low-dose antidiarrheals and antispasmodics respectively. 2.  Diverticulosis on colonoscopy August 2012 3.  Screening colonoscopy.  Due for follow-up.  Motivated.  PLAN:  1.  Continue antispasmodics and antidiarrheals as needed 2.  High-fiber diet 3.  Schedule screening colonoscopy.The nature of the procedure, as well as the risks, benefits, and alternatives were carefully and thoroughly reviewed with the patient. Ample time for discussion and questions allowed. The patient understood, was satisfied, and agreed to proceed.

## 2020-12-11 NOTE — Patient Instructions (Signed)
You have been scheduled for a colonoscopy. Please follow written instructions given to you at your visit today.  Please pick up your prep supplies at the pharmacy within the next 1-3 days. If you use inhalers (even only as needed), please bring them with you on the day of your procedure.  If you are age 70 or older, your body mass index should be between 23-30. Your Body mass index is 26.08 kg/m. If this is out of the aforementioned range listed, please consider follow up with your Primary Care Provider.  ________________________________________________________  The [facility] providers would like to encourage you to use [facility] to communicate with providers for non-urgent requests or questions.  Due to long hold times on the telephone, sending your provider a message by [facility] may be a faster and more efficient way to get a response.  Please allow 48 business hours for a response.  Please remember that this is for non-urgent requests.   Due to recent changes in healthcare laws, you may see the results of your imaging and laboratory studies on MyChart before your provider has had a chance to review them.  We understand that in some cases there may be results that are confusing or concerning to you. Not all laboratory results come back in the same time frame and the provider may be waiting for multiple results in order to interpret others.  Please give [place] 48 hours in order for your provider to thoroughly review all the results before contacting the office for clarification of your results.

## 2021-01-02 ENCOUNTER — Encounter: Payer: Self-pay | Admitting: Obstetrics & Gynecology

## 2021-01-02 DIAGNOSIS — Z1231 Encounter for screening mammogram for malignant neoplasm of breast: Secondary | ICD-10-CM | POA: Diagnosis not present

## 2021-01-04 ENCOUNTER — Encounter: Payer: Self-pay | Admitting: Internal Medicine

## 2021-01-04 ENCOUNTER — Ambulatory Visit (AMBULATORY_SURGERY_CENTER): Payer: Medicare Other | Admitting: Internal Medicine

## 2021-01-04 ENCOUNTER — Other Ambulatory Visit: Payer: Self-pay

## 2021-01-04 VITALS — BP 149/81 | HR 64 | Temp 97.1°F | Resp 18 | Ht 62.0 in | Wt 142.0 lb

## 2021-01-04 DIAGNOSIS — K589 Irritable bowel syndrome without diarrhea: Secondary | ICD-10-CM

## 2021-01-04 DIAGNOSIS — K5730 Diverticulosis of large intestine without perforation or abscess without bleeding: Secondary | ICD-10-CM

## 2021-01-04 DIAGNOSIS — Z1211 Encounter for screening for malignant neoplasm of colon: Secondary | ICD-10-CM

## 2021-01-04 MED ORDER — SODIUM CHLORIDE 0.9 % IV SOLN: 500.0000 mL | Freq: Once | INTRAVENOUS | Status: DC

## 2021-01-04 NOTE — Op Note (Signed)
[facility] Patient Name: Sharon Fox Procedure Date: 01/04/2021 8:53 AM MRN: [id number] Endoscopist: Docia Chuck. Henrene Pastor , MD Age: 70 Referring MD:  Date of Birth: 1951/03/11 Gender: Female Account #: [id number] Procedure:                Colonoscopy Indications:              Screening for colorectal malignant neoplasm.                            Negative examination (diverticulosis) August 2012                            (Dr. Wynetta Emery). Now for follow-up Medicines:                Monitored Anesthesia Care Procedure:                Pre-Anesthesia Assessment:                           - Prior to the procedure, a History and Physical                            was performed, and patient medications and                            allergies were reviewed. The patient's tolerance of                            previous anesthesia was also reviewed. The risks                            and benefits of the procedure and the sedation                            options and risks were discussed with the patient.                            All questions were answered, and informed consent                            was obtained. Prior Anticoagulants: The patient has                            taken no previous anticoagulant or antiplatelet                            agents. ASA Grade Assessment: I - A normal, healthy                            patient. After reviewing the risks and benefits,                            the patient was deemed in satisfactory condition to  undergo the procedure.                           After obtaining informed consent, the colonoscope                            was passed under direct vision. Throughout the                            procedure, the patient's blood pressure, pulse, and                            oxygen saturations were monitored continuously. The                            0441 PCF-H190TL Slim SB Colonoscope was  introduced                            through the anus and advanced to the the cecum,                            identified by appendiceal orifice and ileocecal                            valve. The ileocecal valve, appendiceal orifice,                            and rectum were photographed. The quality of the                            bowel preparation was excellent. The colonoscopy                            was performed without difficulty. The patient                            tolerated the procedure well. The bowel preparation                            used was SUPREP via split dose instruction. Scope In: 9:17:38 AM Scope Out: 9:30:25 AM Scope Withdrawal Time: 0 hours 8 minutes 16 seconds  Total Procedure Duration: 0 hours 12 minutes 47 seconds  Findings:                 Many small and large-mouthed diverticula were found                            in the entire colon. There was marked rectosigmoid                            stenosis requiring the ultrathin pediatric                            colonoscope to navigate.  The exam was otherwise without abnormality on                            direct and retroflexion views. Complications:            No immediate complications. Estimated blood loss:                            None. Estimated Blood Loss:     Estimated blood loss: none. Impression:               - Diverticulosis in the entire examined colon.                            Rectosigmoid stenosis.                           - The examination was otherwise normal on direct                            and retroflexion views.                           - No specimens collected. Recommendation:           - Repeat colonoscopy is not recommended for                            screening purposes.                           - Patient has a contact number available for                            emergencies. The signs and symptoms of potential                             delayed complications were discussed with the                            patient. Return to normal activities tomorrow.                            Written discharge instructions were provided to the                            patient.                           - Resume previous diet.                           - Continue present medications. Docia Chuck. Henrene Pastor, MD 01/04/2021 9:39:14 AM This report has been signed electronically.

## 2021-01-04 NOTE — Progress Notes (Signed)
VS taken by C.W. 

## 2021-01-04 NOTE — Progress Notes (Signed)
pt tolerated well. VSS. awake and to recovery. Report given to RN.  

## 2021-01-04 NOTE — Patient Instructions (Signed)
Resume previous diet and medications Repeat Colonoscopy is not recommended for screening purposes.  YOU HAD AN ENDOSCOPIC PROCEDURE TODAY AT [facility]:   Refer to the procedure report that was given to you for any specific questions about what was found during the examination.  If the procedure report does not answer your questions, please call your gastroenterologist to clarify.  If you requested that your care partner not be given the details of your procedure findings, then the procedure report has been included in a sealed envelope for you to review at your convenience later.  YOU SHOULD EXPECT: Some feelings of bloating in the abdomen. Passage of more gas than usual.  Walking can help get rid of the air that was put into your GI tract during the procedure and reduce the bloating. If you had a lower endoscopy (such as a colonoscopy or flexible sigmoidoscopy) you may notice spotting of blood in your stool or on the toilet paper. If you underwent a bowel prep for your procedure, you may not have a normal bowel movement for a few days.  Please Note:  You might notice some irritation and congestion in your nose or some drainage.  This is from the oxygen used during your procedure.  There is no need for concern and it should clear up in a day or so.  SYMPTOMS TO REPORT IMMEDIATELY:  Following lower endoscopy (colonoscopy or flexible sigmoidoscopy):  Excessive amounts of blood in the stool  Significant tenderness or worsening of abdominal pains  Swelling of the abdomen that is new, acute  Fever of 100F or higher  For urgent or emergent issues, a gastroenterologist can be reached at any hour by calling [phone]. Do not use MyChart messaging for urgent concerns.    DIET:  We do recommend a small meal at first, but then you may proceed to your regular diet.  Drink plenty of fluids but you should avoid alcoholic beverages for 24 hours.  ACTIVITY:  You should plan to take it  easy for the rest of today and you should NOT DRIVE or use heavy machinery until tomorrow (because of the sedation medicines used during the test).    FOLLOW UP: Our staff will call the number listed on your records 48-72 hours following your procedure to check on you and address any questions or concerns that you may have regarding the information given to you following your procedure. If we do not reach you, we will leave a message.  We will attempt to reach you two times.  During this call, we will ask if you have developed any symptoms of COVID 19. If you develop any symptoms (ie: fever, flu-like symptoms, shortness of breath, cough etc.) before then, please call [phone].  If you test positive for Covid 19 in the 2 weeks post procedure, please call and report this information to [place].    If any biopsies were taken you will be contacted by phone or by letter within the next 1-3 weeks.  Please call us at [phone] if you have not heard about the biopsies in 3 weeks.    SIGNATURES/CONFIDENTIALITY: You and/or your care partner have signed paperwork which will be entered into your electronic medical record.  These signatures attest to the fact that that the information above on your After Visit Summary has been reviewed and is understood.  Full responsibility of the confidentiality of this discharge information lies with you and/or your care-partner.

## 2021-01-08 ENCOUNTER — Telehealth: Payer: Self-pay

## 2021-01-08 ENCOUNTER — Telehealth: Payer: Self-pay | Admitting: *Deleted

## 2021-01-08 NOTE — Telephone Encounter (Signed)
NO ANSWER, MESSAGE LEFT FOR PATIENT. 

## 2021-01-08 NOTE — Telephone Encounter (Signed)
  Follow up Call-  Call back number 01/04/2021  Post procedure Call Back phone  # [phone]  Permission to leave phone message Yes  Some recent data might be hidden     Patient questions:  Do you have a fever, pain , or abdominal swelling? No. Pain Score  0 *  Have you tolerated food without any problems? Yes.    Have you been able to return to your normal activities? Yes.    Do you have any questions about your discharge instructions: Diet   No. Medications  No. Follow up visit  No.  Do you have questions or concerns about your Care? No.  Actions: * If pain score is 4 or above: No action needed, pain <4.Have you developed a fever since your procedure? no  2.   Have you had an respiratory symptoms (SOB or cough) since your procedure? no  3.   Have you tested positive for COVID 19 since your procedure no  4.   Have you had any family members/close contacts diagnosed with the COVID 19 since your procedure?  no   If yes to any of these questions please route to Joylene John, RN and Joella Prince, RN

## 2021-02-14 DIAGNOSIS — D2261 Melanocytic nevi of right upper limb, including shoulder: Secondary | ICD-10-CM | POA: Diagnosis not present

## 2021-02-14 DIAGNOSIS — D239 Other benign neoplasm of skin, unspecified: Secondary | ICD-10-CM | POA: Diagnosis not present

## 2021-02-14 DIAGNOSIS — L578 Other skin changes due to chronic exposure to nonionizing radiation: Secondary | ICD-10-CM | POA: Diagnosis not present

## 2021-02-14 DIAGNOSIS — D225 Melanocytic nevi of trunk: Secondary | ICD-10-CM | POA: Diagnosis not present

## 2021-02-14 DIAGNOSIS — D2271 Melanocytic nevi of right lower limb, including hip: Secondary | ICD-10-CM | POA: Diagnosis not present

## 2021-03-04 DIAGNOSIS — Z23 Encounter for immunization: Secondary | ICD-10-CM | POA: Diagnosis not present

## 2021-03-04 DIAGNOSIS — H00015 Hordeolum externum left lower eyelid: Secondary | ICD-10-CM | POA: Diagnosis not present

## 2021-03-14 DIAGNOSIS — I8312 Varicose veins of left lower extremity with inflammation: Secondary | ICD-10-CM | POA: Diagnosis not present

## 2021-03-14 DIAGNOSIS — I8311 Varicose veins of right lower extremity with inflammation: Secondary | ICD-10-CM | POA: Diagnosis not present

## 2021-03-14 DIAGNOSIS — I83893 Varicose veins of bilateral lower extremities with other complications: Secondary | ICD-10-CM | POA: Diagnosis not present

## 2021-03-14 DIAGNOSIS — I83813 Varicose veins of bilateral lower extremities with pain: Secondary | ICD-10-CM | POA: Diagnosis not present

## 2021-03-19 DIAGNOSIS — I8311 Varicose veins of right lower extremity with inflammation: Secondary | ICD-10-CM | POA: Diagnosis not present

## 2021-03-19 DIAGNOSIS — I83893 Varicose veins of bilateral lower extremities with other complications: Secondary | ICD-10-CM | POA: Diagnosis not present

## 2021-03-19 DIAGNOSIS — I83813 Varicose veins of bilateral lower extremities with pain: Secondary | ICD-10-CM | POA: Diagnosis not present

## 2021-03-19 DIAGNOSIS — H6981 Other specified disorders of Eustachian tube, right ear: Secondary | ICD-10-CM | POA: Diagnosis not present

## 2021-03-19 DIAGNOSIS — Z9889 Other specified postprocedural states: Secondary | ICD-10-CM | POA: Diagnosis not present

## 2021-03-19 DIAGNOSIS — H6123 Impacted cerumen, bilateral: Secondary | ICD-10-CM | POA: Diagnosis not present

## 2021-03-19 DIAGNOSIS — I8312 Varicose veins of left lower extremity with inflammation: Secondary | ICD-10-CM | POA: Diagnosis not present

## 2021-03-20 ENCOUNTER — Ambulatory Visit (INDEPENDENT_AMBULATORY_CARE_PROVIDER_SITE_OTHER): Payer: Medicare Other | Admitting: Obstetrics & Gynecology

## 2021-03-20 ENCOUNTER — Other Ambulatory Visit: Payer: Self-pay

## 2021-03-20 ENCOUNTER — Encounter: Payer: Self-pay | Admitting: Obstetrics & Gynecology

## 2021-03-20 VITALS — BP 112/74 | Temp 98.2°F

## 2021-03-20 DIAGNOSIS — N951 Menopausal and female climacteric states: Secondary | ICD-10-CM | POA: Diagnosis not present

## 2021-03-20 DIAGNOSIS — R350 Frequency of micturition: Secondary | ICD-10-CM

## 2021-03-20 LAB — WET PREP FOR TRICH, YEAST, CLUE

## 2021-03-20 MED ORDER — SULFAMETHOXAZOLE-TRIMETHOPRIM 800-160 MG PO TABS: 1.0000 | ORAL_TABLET | Freq: Two times a day (BID) | ORAL | 0 refills | Status: AC

## 2021-03-20 NOTE — Progress Notes (Signed)
    Sharon Fox May 25, 1951 [id number]        70 y.o.  G1P1L1   RP: Urinary frequency and vaginal dryness  HPI: Long standing urinary frequency.  No dysuria or blood in urine.  No fever.  C/O vaginal dryness with painful IC in spite of her Estring which was helping previously. Tried coconut oil.  Left lower abdominal discomfort.  Recent Colonoscopy.   OB History  Gravida Para Term Preterm AB Living  1 1 1  0 0 1  SAB IAB Ectopic Multiple Live Births  0 0 0   1    # Outcome Date GA Lbr Len/2nd Weight Sex Delivery Anes PTL Lv  1 Term 77    F CS-Unspec   LIV    Past medical history,surgical history, problem list, medications, allergies, family history and social history were all reviewed and documented in the EPIC chart.   Directed ROS with pertinent positives and negatives documented in the history of present illness/assessment and plan.  Exam:  Vitals:   03/20/21 1021  BP: 112/74  Temp: 98.2 F (36.8 C)  TempSrc: Oral   General appearance:  Normal  Abdomen: Normal  Gynecologic exam: Vulva postmenopausal atrophy.  Speculum:  Cervix/vagina normal.  No discharge.  Wet prep done.  Bimanual exam:  Uterus AV, normal volume, mobile, NT.  No adnexal mass/NT.  Wet prep: Negative U/A: Yellow cloudy, Nit Neg, Pro Neg, WBC >60, RBC 3-10, Bacteria many.  Pending Urine Culture.   Assessment/Plan:  70 y.o. G1P1001   1. Urinary frequency Abnormal urine analysis.  Decision to treat with Bactrim DS 1 tablet twice a day for 3 days.  Usage reviewed and prescription sent to pharmacy.  Recommend pushing water intake.  Pending urine culture. - Urinalysis,Complete w/RFL Culture  2. Vaginal dryness, menopausal Wet prep negative, patient reassured.  We will change her Estring now. - WET PREP FOR [place], YEAST, CLUE  Other orders - meloxicam (MOBIC) 7.5 MG tablet; Take 7.5 mg by mouth daily as needed. - zolpidem (AMBIEN) 5 MG tablet; 2.5 mg. - fluticasone (FLONASE) 50 MCG/ACT nasal  spray; Place into both nostrils daily. - sulfamethoxazole-trimethoprim (BACTRIM DS) 800-160 MG tablet; Take 1 tablet by mouth 2 (two) times daily for 3 days.   [name] MD, 10:29 AM 03/20/2021

## 2021-03-21 ENCOUNTER — Encounter: Payer: Self-pay | Admitting: Obstetrics & Gynecology

## 2021-03-22 ENCOUNTER — Telehealth: Payer: Self-pay | Admitting: *Deleted

## 2021-03-22 LAB — URINALYSIS, COMPLETE W/RFL CULTURE
Bilirubin Urine: NEGATIVE
Glucose, UA: NEGATIVE
Hyaline Cast: NONE SEEN /LPF
Ketones, ur: NEGATIVE
Nitrites, Initial: NEGATIVE
Protein, ur: NEGATIVE
Specific Gravity, Urine: 1.025 (ref 1.001–1.035)
WBC, UA: >=60 /HPF — AB (ref 0–5)
pH: 5 (ref 5.0–8.0)

## 2021-03-22 LAB — URINE CULTURE
MICRO NUMBER:: 12523347
SPECIMEN QUALITY:: ADEQUATE

## 2021-03-22 LAB — CULTURE INDICATED

## 2021-03-22 NOTE — Telephone Encounter (Signed)
Patient called to follow up from [facility] on 03/20/21, will completed last dose of Bactrim tomorrow( 03/23/21) patient said based on results should she have repeat u/a? Reports this would be her 3rd UTI IN 3 months. Report she still has slight frequency. Please advise

## 2021-03-22 NOTE — Telephone Encounter (Signed)
PROLIA GIVEN 11/22/2020 NEXT INJECTION 05/25/2021

## 2021-03-22 NOTE — Telephone Encounter (Signed)
Prolia insurance verification has been sent awaiting Summary of benefits  

## 2021-03-25 NOTE — Telephone Encounter (Signed)
Patient informed. 

## 2021-03-25 NOTE — Telephone Encounter (Signed)
Left message for patient to call.

## 2021-03-26 DIAGNOSIS — I8311 Varicose veins of right lower extremity with inflammation: Secondary | ICD-10-CM | POA: Diagnosis not present

## 2021-03-26 DIAGNOSIS — I83813 Varicose veins of bilateral lower extremities with pain: Secondary | ICD-10-CM | POA: Diagnosis not present

## 2021-03-26 DIAGNOSIS — I8312 Varicose veins of left lower extremity with inflammation: Secondary | ICD-10-CM | POA: Diagnosis not present

## 2021-04-03 NOTE — Telephone Encounter (Signed)
Sharon Fox, RMA     8:41 AM Note   Dr.Lavoie replied " The Bone Density order is for a repeat Bone Density in 1 year given that we made the decision not to start on bone medication, but rather improve Vit D/Ca++ and weight bearing physical activities. "     Will route to Abigail Butts so she is aware.     November 27, 2020 [name], MD to Sharon Fox, RMA     11:03 PM The Bone Density order is for a repeat Bone Density in 1 year given that we made the decision not to start on bone medication, but rather improve Vit D/Ca++ and weight bearing physical activities.

## 2021-04-03 NOTE — Telephone Encounter (Addendum)
Deductible [id number] [phone] met)  OOP MAX n/a  Annual exam 07/04/2020   Calcium             Date needs labs  Upcoming dental procedures no  Prior Authorization needed no  Pt estimated Cost $0   Lm     Coverage Details:This is a Medicare [place] and it covers the Medicare Part B deductible, co-insurance and 100% of the excess charges. T

## 2021-05-07 DIAGNOSIS — Z20822 Contact with and (suspected) exposure to covid-19: Secondary | ICD-10-CM | POA: Diagnosis not present

## 2021-06-05 DIAGNOSIS — I8312 Varicose veins of left lower extremity with inflammation: Secondary | ICD-10-CM | POA: Diagnosis not present

## 2021-06-13 DIAGNOSIS — I8312 Varicose veins of left lower extremity with inflammation: Secondary | ICD-10-CM | POA: Diagnosis not present

## 2021-06-17 DIAGNOSIS — I8311 Varicose veins of right lower extremity with inflammation: Secondary | ICD-10-CM | POA: Diagnosis not present

## 2021-06-19 DIAGNOSIS — I8311 Varicose veins of right lower extremity with inflammation: Secondary | ICD-10-CM | POA: Diagnosis not present

## 2021-06-27 DIAGNOSIS — I8312 Varicose veins of left lower extremity with inflammation: Secondary | ICD-10-CM | POA: Diagnosis not present

## 2021-06-27 DIAGNOSIS — M7981 Nontraumatic hematoma of soft tissue: Secondary | ICD-10-CM | POA: Diagnosis not present

## 2021-07-05 ENCOUNTER — Ambulatory Visit: Payer: Medicare Other | Admitting: Obstetrics & Gynecology

## 2021-07-16 DIAGNOSIS — Z20822 Contact with and (suspected) exposure to covid-19: Secondary | ICD-10-CM | POA: Diagnosis not present

## 2021-07-17 DIAGNOSIS — I8311 Varicose veins of right lower extremity with inflammation: Secondary | ICD-10-CM | POA: Diagnosis not present

## 2021-07-17 NOTE — Progress Notes (Signed)
07/18/2021 RAESHAWN TAFOLLA [id number] [birth year]   ASSESSMENT AND PLAN:   Irritable bowel syndrome with diarrhea Normal colon 12/2020, history of negative celiac Likely IBS-D with increase stress but since worse with food will get pancreatic elastase, with recent ABX will get Cdiff, stool more formed and feeling better so no need for Gi pathogen panel at this time.  Discussed FODMAP diet, can continue imodium as needed/liquid imodium Wants follow up 6 weeks.  -     Pancreatic elastase, fecal; Future -     Clostridium difficile Toxin B, Qualitative, Real-Time PCR; Future -     CBC with Differential/Platelet; Future -     Comprehensive metabolic panel; Future  Diverticulosis of colon without hemorrhage Will call if any symptoms. Add on fiber supplement, avoid NSAIDS, information given  B12 deficiency -     Vitamin B12; Future -     Folate; Future - if B12 still low and folate elevated can consider SIBO as diagnosis- could do empiric treatment.    Future Appointments  Date Time Provider [place]  07/18/2021  2:30 PM [name], MD [facility] None    History of Present Illness:  71 y.o. female  with a past medical history of Alcohol abuse, intracranial bleed, osteopenia, IBS with diarrhea and others listed below, known to Dr. Henrene Pastor returns to clinic today for follow-up of Diarrhea.  No history of abdominal imaging GI pathogen and C. difficile panels -2020 Negative celiac panel 2019 Colon 01/04/21 for screening Diverticulosis in the entire examined colon.  - The examination was otherwise normal on direct and retroflexion views. - No specimens collected.  Notes reviewed from [facility] gastro[facility] with Doreene Adas, and follow with Dr. Inda Merlin PCP there.  BUN 21, creatinine 0.40 potassium 4.3 no leukocytosis, no anemia, platelets 324, thyroid 1.02  sed rate 20 CRP 26 B12 234 on 07/28/2019- she is not on B12 at this time.  She states she goes through bouts  of diarrhea.  For the last 2 weeks she has had diarrhea, she would have diarrhea after lunch.  She would have lower AB cramping with BM, with urgency and very watery.  Patient states 1/2 imodium before she ate helped.  Last 2 days had been more normally 2 times a day, can have up to 3-4 BM's a day. She states can be worse with stress she has difficult project at this time.  Patient denies blood in stool, fever, chills, unintentional weight loss.  Denies nausea or vomiting.  Denies close contacts ill, recent camping. Had ABX in august for UTI.    Current Medications:     Current Outpatient Medications (Respiratory):    fluticasone (FLONASE) 50 MCG/ACT nasal spray, Place into both nostrils daily.   loratadine (CLARITIN) 10 MG tablet, Take 1 tablet (10 mg total) by mouth daily as needed for allergies.  Current Outpatient Medications (Analgesics):    meloxicam (MOBIC) 7.5 MG tablet, Take 7.5 mg by mouth daily as needed.   Current Outpatient Medications (Other):    ESTRING 2 MG vaginal ring, Place 2 mg vaginally every 3 (three) months.   hyoscyamine (ANASPAZ) 0.125 MG TBDP disintergrating tablet, Place 1 tablet under the tongue 2 (two) times daily as needed for spasms.   Multiple Vitamins-Minerals (CENTRUM SILVER PO), Take 1 capsule by mouth daily.   Probiotic Product (ALIGN) 4 MG CAPS, Take 1 capsule by mouth daily.   zolpidem (AMBIEN) 5 MG tablet, 2.5 mg.  Surgical History:  She  has a past surgical history  that includes Cesarean section; Cosmetic surgery; Colonoscopy; Foot surgery; Rhinoplasty; right ear surgery (Right, 2021); and Vein Surgery. Family History:  Her family history includes Breast cancer in her maternal grandmother, paternal grandmother, sister, and sister; Heart attack in her paternal grandfather; Stroke in her father. Social History:   reports that she has never smoked. She has never used smokeless tobacco. She reports current alcohol use. She reports that she does  not use drugs.  Current Medications, Allergies, Past Medical History, Past Surgical History, Family History and Social History were reviewed in [employer] record.  Physical Exam: BP 122/78    Pulse 78    Resp (!) 98    Ht 5\' 3"  (1.6 m)    Wt 143 lb (64.9 kg)    BMI 25.33 kg/m  General:   Pleasant, well developed female in no acute distress Eyes: sclerae anicteric,conjunctive pink  Heart:  regular rate and rhythm Pulm: Clear anteriorly; no wheezing Abdomen:  Soft, Flat AB, skin exam , Normal bowel sounds.  no  tenderness . , without hepatomegaly. Extremities:  Without edema. Peripheral pulses intact.  Neurologic:  Alert and  oriented x4;  grossly normal neurologically. Skin:   Dry and intact without significant lesions or rashes. Psychiatric: Demonstrates good judgement and reason without abnormal affect or behaviors.  Vladimir Crofts, PA-C 07/18/21

## 2021-07-18 ENCOUNTER — Encounter: Payer: Self-pay | Admitting: Obstetrics & Gynecology

## 2021-07-18 ENCOUNTER — Other Ambulatory Visit (INDEPENDENT_AMBULATORY_CARE_PROVIDER_SITE_OTHER): Payer: Medicare Other

## 2021-07-18 ENCOUNTER — Ambulatory Visit (INDEPENDENT_AMBULATORY_CARE_PROVIDER_SITE_OTHER): Payer: Medicare Other | Admitting: Physician Assistant

## 2021-07-18 ENCOUNTER — Other Ambulatory Visit: Payer: Self-pay

## 2021-07-18 ENCOUNTER — Ambulatory Visit (INDEPENDENT_AMBULATORY_CARE_PROVIDER_SITE_OTHER): Payer: Medicare Other | Admitting: Obstetrics & Gynecology

## 2021-07-18 ENCOUNTER — Encounter: Payer: Self-pay | Admitting: Physician Assistant

## 2021-07-18 VITALS — BP 122/78 | HR 78 | Resp 98 | Ht 63.0 in | Wt 143.0 lb

## 2021-07-18 VITALS — BP 118/76 | HR 84 | Resp 16 | Ht 61.25 in | Wt 144.0 lb

## 2021-07-18 DIAGNOSIS — N952 Postmenopausal atrophic vaginitis: Secondary | ICD-10-CM

## 2021-07-18 DIAGNOSIS — K5730 Diverticulosis of large intestine without perforation or abscess without bleeding: Secondary | ICD-10-CM

## 2021-07-18 DIAGNOSIS — M810 Age-related osteoporosis without current pathological fracture: Secondary | ICD-10-CM | POA: Diagnosis not present

## 2021-07-18 DIAGNOSIS — K580 Irritable bowel syndrome with diarrhea: Secondary | ICD-10-CM

## 2021-07-18 DIAGNOSIS — Z9189 Other specified personal risk factors, not elsewhere classified: Secondary | ICD-10-CM | POA: Diagnosis not present

## 2021-07-18 DIAGNOSIS — E538 Deficiency of other specified B group vitamins: Secondary | ICD-10-CM | POA: Diagnosis not present

## 2021-07-18 DIAGNOSIS — Z780 Asymptomatic menopausal state: Secondary | ICD-10-CM

## 2021-07-18 DIAGNOSIS — Z01419 Encounter for gynecological examination (general) (routine) without abnormal findings: Secondary | ICD-10-CM

## 2021-07-18 LAB — COMPREHENSIVE METABOLIC PANEL
ALT: 13 U/L (ref 0–35)
AST: 18 U/L (ref 0–37)
Albumin: 4.1 g/dL (ref 3.5–5.2)
Alkaline Phosphatase: 66 U/L (ref 39–117)
BUN: 17 mg/dL (ref 6–23)
CO2: 33 mEq/L — ABNORMAL HIGH (ref 19–32)
Calcium: 9.4 mg/dL (ref 8.4–10.5)
Chloride: 103 mEq/L (ref 96–112)
Creatinine, Ser: 0.41 mg/dL (ref 0.40–1.20)
GFR: 99.22 mL/min (ref >60.00)
Glucose, Bld: 92 mg/dL (ref 70–99)
Potassium: 4 mEq/L (ref 3.5–5.1)
Sodium: 139 mEq/L (ref 135–145)
Total Bilirubin: 0.5 mg/dL (ref 0.2–1.2)
Total Protein: 7.5 g/dL (ref 6.0–8.3)

## 2021-07-18 LAB — CBC WITH DIFFERENTIAL/PLATELET
Basophils Absolute: 0.1 10*3/uL (ref 0.0–0.1)
Basophils Relative: 1.1 % (ref 0.0–3.0)
Eosinophils Absolute: 0.1 10*3/uL (ref 0.0–0.7)
Eosinophils Relative: 2 % (ref 0.0–5.0)
HCT: 37.1 % (ref 36.0–46.0)
Hemoglobin: 12.2 g/dL (ref 12.0–15.0)
Lymphocytes Relative: 29.4 % (ref 12.0–46.0)
Lymphs Abs: 1.5 10*3/uL (ref 0.7–4.0)
MCHC: 32.9 g/dL (ref 30.0–36.0)
MCV: 89.5 fl (ref 78.0–100.0)
Monocytes Absolute: 0.4 10*3/uL (ref 0.1–1.0)
Monocytes Relative: 8.3 % (ref 3.0–12.0)
Neutro Abs: 3.1 10*3/uL (ref 1.4–7.7)
Neutrophils Relative %: 59.2 % (ref 43.0–77.0)
Platelets: 396 10*3/uL (ref 150.0–400.0)
RBC: 4.14 Mil/uL (ref 3.87–5.11)
RDW: 13.1 % (ref 11.5–15.5)
WBC: 5.2 10*3/uL (ref 4.0–10.5)

## 2021-07-18 LAB — VITAMIN B12: Vitamin B-12: 293 pg/mL (ref 211–911)

## 2021-07-18 LAB — FOLATE: Folate: >24.2 ng/mL (ref >5.9)

## 2021-07-18 MED ORDER — ESTRING 2 MG VA RING: 2.0000 mg | VAGINAL_RING | VAGINAL | 4 refills | Status: DC

## 2021-07-18 NOTE — Patient Instructions (Addendum)
If you are age 71 or older, your body mass index should be between 23-30. Your Body mass index is 25.33 kg/m. If this is out of the aforementioned range listed, please consider follow up with your Primary Care Provider. ________________________________________________________  The [place] [facility] providers would like to encourage you to use [facility] to communicate with providers for non-urgent requests or questions.  Due to long hold times on the telephone, sending your provider a message by [facility] may be a faster and more efficient way to get a response.  Please allow 48 business hours for a response.  Please remember that this is for non-urgent requests.  _______________________________________________________  Your provider has requested that you go to the basement level for lab work before leaving today. Press "B" on the elevator. The lab is located at the first door on the left as you exit the elevator.  Due to recent changes in healthcare laws, you may see the results of your imaging and laboratory studies on MyChart before your provider has had a chance to review them.  We understand that in some cases there may be results that are confusing or concerning to you. Not all laboratory results come back in the same time frame and the provider may be waiting for multiple results in order to interpret others.  Please give [place] 48 hours in order for your provider to thoroughly review all the results before contacting the office for clarification of your results.   You are scheduled to follow up in the office on 08-23-21 at 10:am.   Diverticulosis Diverticulosis is a condition that develops when small pouches (diverticula) form in the wall of the large intestine (colon). The colon is where water is absorbed and stool (feces) is formed. The pouches form when the inside layer of the colon pushes through weak spots in the outer layers of the colon. You may have a few pouches or many of them. The pouches usually  do not cause problems unless they become inflamed or infected. When this happens, the condition is called diverticulitis- this is left lower quadrant pain, diarrhea, fever, chills, nausea or vomiting.  If this occurs please call the office or go to the hospital. Sometimes these patches without inflammation can also have painless bleeding associated with them, if this happens please call the office or go to the hospital. Preventing constipation and increasing fiber can help reduce diverticula and prevent complications. Even if you feel you have a high-fiber diet, suggest getting on Benefiber 2-3 times daily. What increases the risk? The following factors may make you more likely to develop this condition: Being older than age 88. Your risk for this condition increases with age. Diverticulosis is rare among people younger than age 42. By age 29, many people have it. Eating a low-fiber diet. Having frequent constipation. Being overweight. Not getting enough exercise. Smoking. Taking over-the-counter pain medicines, like aspirin and ibuprofen.- can increase a flare.  Having a family history of diverticulosis. How is this diagnosed? Because diverticulosis usually has no symptoms, it is most often diagnosed during an exam for other colon problems. The condition may be diagnosed by: Using a flexible scope to examine the colon (colonoscopy). Taking an X-ray of the colon after dye has been put into the colon (barium enema). Having a CT scan. How is this treated? You may not need treatment for this condition. Your health care provider may recommend treatment to prevent problems. You may need treatment if you have symptoms or if you previously had  diverticulitis. Treatment may include: Eating a high-fiber diet. Taking a fiber supplement. Taking a live bacteria supplement (probiotic). Taking medicine to relax your colon. Contact a health care provider if you: Have pain in your abdomen. Have  bloating. Have cramps. Have not had a bowel movement in 3 days. Get help right away if: Your pain gets worse. Your bloating becomes very bad. You have a fever or chills, and your symptoms suddenly get worse. You vomit. You have bowel movements that are bloody or black. You have bleeding from your rectum. Summary Diverticulosis is a condition that develops when small pouches (diverticula) form in the wall of the large intestine (colon). You may have a few pouches or many of them. This condition is most often diagnosed during an exam for other colon problems. Treatment may include increasing the fiber in your diet, taking supplements, or taking medicines. This information is not intended to replace advice given to you by your health care provider. Make sure you discuss any questions you have with your health care provider. Document Revised: 12/16/2018 Document Reviewed: 12/16/2018 Elsevier Patient Education  2022 [employer].   If diarrhea is severe - 4+ watery diarrhea episodes, take imodium to reduce fluid loss, and start on electrolyte supplemented fluids.   Please go to the ER if you have any severe AB pain, unable to hold down food/water, blood in stool or vomit, chest pain, shortness of breath, or any worsening symptoms.    Consider keeping a food diary- common causes of diarrhea are dairy, certain carbs...   FODMAP stands for fermentable oligo-, di-, mono-saccharides and polyols (1). These are the scientific terms used to classify groups of carbs that are notorious for triggering digestive symptoms like bloating, gas and stomach pain.   FODMAPs are found in a wide range of foods in varying amounts. Some foods contain just one type, while others contain several.  The main dietary sources of the four groups of FODMAPs include:  Oligosaccharides: Wheat, rye, legumes and various fruits and vegetables, such as garlic and onions.  Disaccharides: Milk, yogurt and soft cheese.  Lactose is the main carb.  Monosaccharides: Various fruit including figs and mangoes, and sweeteners such as honey and agave nectar. Fructose is the main carb.  Polyols: Certain fruits and vegetables including blackberries and lychee, as well as some low-calorie sweeteners like those in sugar-free gum.   Keep a food diary. This will help you identify foods that cause symptoms. Write down: What you eat and when. What symptoms you have. When symptoms occur in relation to your meals. Avoid foods that cause symptoms. Talk with your dietitian about other ways to get the same nutrients that are in these foods. Eat your meals slowly, in a relaxed setting. Aim to eat 5-6 small meals per day. Do not skip meals. Drink enough fluids to keep your urine clear or pale yellow. If dairy products cause your symptoms to flare up, try eating less of them. You might be able to handle yogurt better than other dairy products because it contains bacteria that help with digestion.      Thank you for entrusting me with your care and choosing [facility].  Vicie Mutters, PA-C

## 2021-07-18 NOTE — Progress Notes (Signed)
Sharon Fox 1950/11/16 [id number]   History:    71 y.o.  G1P1L1 Married.  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   RP:  Established patient presenting for annual gyn exam    HPI: Postmenopausal, well on no hormone replacement therapy.  No postmenopausal bleeding.  No pelvic pain.  No pain with intercourse, using Estring.  Pap Neg 05/2019.  Urine and bowel movements normal. Breasts normal.  Mammo 12/2020 Neg.  Body mass index 26.99.  BD Osteoporosis T-Score -2.6 at the Lt Femoral Neck  in 07/2020.  Decision not to start on Bone Rx and to continue with weight bearing activities, walking and gardening and take Vit D supplements, Ca++ total of 1.5 g/d. Will repeat a BD in 08/2022.  Health labs with family physician.   Past medical history,surgical history, family history and social history were all reviewed and documented in the EPIC chart.  Gynecologic History No LMP recorded. Patient is postmenopausal.   Obstetric History OB History  Gravida Para Term Preterm AB Living  1 1 1  0 0 1  SAB IAB Ectopic Multiple Live Births  0 0 0   1    # Outcome Date GA Lbr Len/2nd Weight Sex Delivery Anes PTL Lv  1 Term 52    F CS-Unspec   LIV     ROS: A ROS was performed and pertinent positives and negatives are included in the history.  GENERAL: No fevers or chills. HEENT: No change in vision, no earache, sore throat or sinus congestion. NECK: No pain or stiffness. CARDIOVASCULAR: No chest pain or pressure. No palpitations. PULMONARY: No shortness of breath, cough or wheeze. GASTROINTESTINAL: No abdominal pain, nausea, vomiting or diarrhea, melena or bright red blood per rectum. GENITOURINARY: No urinary frequency, urgency, hesitancy or dysuria. MUSCULOSKELETAL: No joint or muscle pain, no back pain, no recent trauma. DERMATOLOGIC: No rash, no itching, no lesions. ENDOCRINE: No polyuria, polydipsia, no heat or cold intolerance. No recent change in weight. HEMATOLOGICAL: No  anemia or easy bruising or bleeding. NEUROLOGIC: No headache, seizures, numbness, tingling or weakness. PSYCHIATRIC: No depression, no loss of interest in normal activity or change in sleep pattern.     Exam:   BP 118/76    Pulse 84    Resp 16    Ht 5' 1.25" (1.556 m)    Wt 144 lb (65.3 kg)    BMI 26.99 kg/m   Body mass index is 26.99 kg/m.  General appearance : Well developed well nourished female. No acute distress HEENT: Eyes: no retinal hemorrhage or exudates,  Neck supple, trachea midline, no carotid bruits, no thyroidmegaly Lungs: Clear to auscultation, no rhonchi or wheezes, or rib retractions  Heart: Regular rate and rhythm, no murmurs or gallops Breast:Examined in sitting and supine position were symmetrical in appearance, no palpable masses or tenderness,  no skin retraction, no nipple inversion, no nipple discharge, no skin discoloration, no axillary or supraclavicular lymphadenopathy Abdomen: no palpable masses or tenderness, no rebound or guarding Extremities: no edema or skin discoloration or tenderness  Pelvic: Vulva: Normal             Vagina: No gross lesions or discharge  Cervix: No gross lesions or discharge  Uterus  AV, normal size, shape and consistency, non-tender and mobile  Adnexa  Without masses or tenderness  Anus: Normal   Assessment/Plan:  71 y.o. female for annual exam   1. Well female exam with routine gynecological exam Postmenopausal, well on no hormone  replacement therapy.  No postmenopausal bleeding.  No pelvic pain.  No pain with intercourse, using Estring.  Pap Neg 05/2019.  Urine and bowel movements normal. Breasts normal.  Mammo 12/2020 Neg.  Body mass index 26.99.  BD Osteoporosis T-Score -2.6 at the Lt Femoral Neck  in 07/2020.  Decision not to start on Bone Rx and to continue with weight bearing activities, walking and gardening and take Vit D supplements, Ca++ total of 1.5 g/d. Will repeat a BD in 08/2022.  Health labs with family physician.  2.  At risk of fracture due to osteoporosis  3. Postmenopause Postmenopausal, well on no hormone replacement therapy.  No postmenopausal bleeding.  No pelvic pain.  No pain with intercourse, using Estring.   4. Post-menopausal atrophic vaginitis  5. Age-related osteoporosis without current pathological fracture No pain with intercourse, using Estring. No CI to continue.  Prescription sent to pharmacy.  6. Other specified personal risk factors, not elsewhere classified  Other orders - ESTRING 2 MG vaginal ring; Place 2 mg vaginally every 3 (three) months.   [name] MD, 2:55 PM 07/18/2021

## 2021-07-18 NOTE — Progress Notes (Signed)
Assessment and plan noted ?

## 2021-07-22 ENCOUNTER — Other Ambulatory Visit: Payer: Medicare Other

## 2021-07-22 DIAGNOSIS — K580 Irritable bowel syndrome with diarrhea: Secondary | ICD-10-CM | POA: Diagnosis not present

## 2021-07-23 LAB — CLOSTRIDIUM DIFFICILE TOXIN B, QUALITATIVE, REAL-TIME PCR: Toxigenic C. Difficile by PCR: NOT DETECTED

## 2021-07-26 DIAGNOSIS — Z03818 Encounter for observation for suspected exposure to other biological agents ruled out: Secondary | ICD-10-CM | POA: Diagnosis not present

## 2021-07-26 DIAGNOSIS — J209 Acute bronchitis, unspecified: Secondary | ICD-10-CM | POA: Diagnosis not present

## 2021-07-26 DIAGNOSIS — R051 Acute cough: Secondary | ICD-10-CM | POA: Diagnosis not present

## 2021-07-30 LAB — PANCREATIC ELASTASE, FECAL: Pancreatic Elastase-1, Stool: >500 mcg/g

## 2021-07-31 DIAGNOSIS — M7981 Nontraumatic hematoma of soft tissue: Secondary | ICD-10-CM | POA: Diagnosis not present

## 2021-07-31 DIAGNOSIS — I8311 Varicose veins of right lower extremity with inflammation: Secondary | ICD-10-CM | POA: Diagnosis not present

## 2021-08-01 DIAGNOSIS — S0219XD Other fracture of base of skull, subsequent encounter for fracture with routine healing: Secondary | ICD-10-CM | POA: Diagnosis not present

## 2021-08-01 DIAGNOSIS — H9311 Tinnitus, right ear: Secondary | ICD-10-CM | POA: Diagnosis not present

## 2021-08-01 DIAGNOSIS — H90A31 Mixed conductive and sensorineural hearing loss, unilateral, right ear with restricted hearing on the contralateral side: Secondary | ICD-10-CM | POA: Diagnosis not present

## 2021-08-01 DIAGNOSIS — Z9889 Other specified postprocedural states: Secondary | ICD-10-CM | POA: Diagnosis not present

## 2021-08-13 DIAGNOSIS — Z20822 Contact with and (suspected) exposure to covid-19: Secondary | ICD-10-CM | POA: Diagnosis not present

## 2021-08-21 DIAGNOSIS — I629 Nontraumatic intracranial hemorrhage, unspecified: Secondary | ICD-10-CM | POA: Diagnosis not present

## 2021-08-21 DIAGNOSIS — E782 Mixed hyperlipidemia: Secondary | ICD-10-CM | POA: Diagnosis not present

## 2021-08-21 DIAGNOSIS — Z1211 Encounter for screening for malignant neoplasm of colon: Secondary | ICD-10-CM | POA: Diagnosis not present

## 2021-08-21 DIAGNOSIS — E559 Vitamin D deficiency, unspecified: Secondary | ICD-10-CM | POA: Diagnosis not present

## 2021-08-21 DIAGNOSIS — R198 Other specified symptoms and signs involving the digestive system and abdomen: Secondary | ICD-10-CM | POA: Diagnosis not present

## 2021-08-21 DIAGNOSIS — K5790 Diverticulosis of intestine, part unspecified, without perforation or abscess without bleeding: Secondary | ICD-10-CM | POA: Diagnosis not present

## 2021-08-21 DIAGNOSIS — Z0001 Encounter for general adult medical examination with abnormal findings: Secondary | ICD-10-CM | POA: Diagnosis not present

## 2021-08-21 DIAGNOSIS — Z79899 Other long term (current) drug therapy: Secondary | ICD-10-CM | POA: Diagnosis not present

## 2021-08-21 DIAGNOSIS — K130 Diseases of lips: Secondary | ICD-10-CM | POA: Diagnosis not present

## 2021-08-21 NOTE — Progress Notes (Signed)
? ? ?  08/23/2021 ?Sharon Fox ?[id number] ?[birth year] ? ? ?ASSESSMENT AND PLAN:  ? ?Irritable bowel syndrome with diarrhea ?Improving with diet changes and decreasing stress ?Can do IBGARD as needed ?Consider xifaxin trial for possible IBS-D versus SIBO ? ?B12 deficiency ?Get on sublingual B12 ?Low B12 and elevated folate can point towards SIBO, improved for now, will not treat but can consider in the future.  ?Will follow up as needed.  ? ? ?History of Present Illness:  ?71 y.o. female  with a past medical history of Alcohol abuse, intracranial bleed, osteopenia, IBS with diarrhea  and others listed below, known to Dr. Henrene Pastor returns to clinic today for evaluation of diarrhea. ? ?Patient was last seen 07/18/2021 had normal pancreatic elastase, negative C. difficile, normal CBC without anemia or infection, normal kidney and liver, patient did have low B12 and elevated folate.   ?Can see if patient could have Xifaxan samples for possible SIBO. ? ?She states she is doing better, has until the 18th with the difficult project at work. States her symptoms have resolved.  ?Not interested ? ?GI pathogen and C. difficile panels -2020 ?Negative celiac panel 2019 ?Colon 01/04/21 for screening ?Diverticulosis in the entire examined colon.  ?- The examination was otherwise normal on direct and retroflexion views. ?- No specimens collected. ? ?Current Medications:  ? ? ? ?Current Outpatient Medications (Respiratory):  ?  fluticasone (FLONASE) 50 MCG/ACT nasal spray, Place into both nostrils daily. ?  loratadine (CLARITIN) 10 MG tablet, Take 1 tablet (10 mg total) by mouth daily as needed for allergies. ? ?Current Outpatient Medications (Analgesics):  ?  meloxicam (MOBIC) 7.5 MG tablet, Take 7.5 mg by mouth daily as needed. ? ? ?Current Outpatient Medications (Other):  ?  ESTRING 2 MG vaginal ring, Place 2 mg vaginally every 3 (three) months. ?  hyoscyamine (ANASPAZ) 0.125 MG TBDP disintergrating tablet, Place 1 tablet under  the tongue 2 (two) times daily as needed for spasms. ?  Multiple Vitamins-Minerals (CENTRUM SILVER PO), Take 1 capsule by mouth daily. ?  Probiotic Product (ALIGN) 4 MG CAPS, Take 1 capsule by mouth daily. ?  zolpidem (AMBIEN) 5 MG tablet, 2.5 mg as needed. ? ?Surgical History:  ?She  has a past surgical history that includes Cesarean section; Cosmetic surgery; Colonoscopy; Foot surgery; Rhinoplasty; right ear surgery (Right, 2021); and Vein Surgery. ?Family History:  ?Her family history includes Breast cancer in her maternal grandmother, paternal grandmother, sister, and sister; Heart attack in her paternal grandfather; Stroke in her father. ?Social History:  ? reports that she has never smoked. She has never used smokeless tobacco. She reports current alcohol use. She reports that she does not use drugs. ? ?Current Medications, Allergies, Past Medical History, Past Surgical History, Family History and Social History were reviewed in [employer] record. ? ?Physical Exam: ?BP 114/62   Pulse 80   Ht 5' 1.25" (1.556 m)   Wt 144 lb 8 oz (65.5 kg)   BMI 27.08 kg/m?  ?General:   Pleasant, well developed female in no acute distress ?Abdomen:  Soft, Flat AB, Normal bowel sounds.  no  tenderness Without guarding and Without rebound, without hepatomegaly. ?Psychiatric: Demonstrates good judgement and reason without abnormal affect or behaviors. ? ?Vladimir Crofts, PA-C ?08/23/21 ?

## 2021-08-23 ENCOUNTER — Ambulatory Visit (INDEPENDENT_AMBULATORY_CARE_PROVIDER_SITE_OTHER): Payer: Medicare Other | Admitting: Physician Assistant

## 2021-08-23 ENCOUNTER — Encounter: Payer: Self-pay | Admitting: Physician Assistant

## 2021-08-23 VITALS — BP 114/62 | HR 80 | Ht 61.25 in | Wt 144.5 lb

## 2021-08-23 DIAGNOSIS — K580 Irritable bowel syndrome with diarrhea: Secondary | ICD-10-CM | POA: Diagnosis not present

## 2021-08-23 DIAGNOSIS — E538 Deficiency of other specified B group vitamins: Secondary | ICD-10-CM

## 2021-08-23 NOTE — Patient Instructions (Addendum)
B12 is mainly in meat, so increase meat can help this but often people have a deficiency that they are just not absorbing it well with meat or pills, so get the sublingual/melt in your mouth one.  ? ?Can consider xifaxin samples for small intestinal overgrowth syndrome, had low B12 and high folate.  ? ?Can do trial of FDGard over the counter.  ?Please try low FODMAP diet ?Try trial off milk/lactose products.  ?Add fiber like benefiber or citracel once a day ?Increase activity ?Can do trial of IBGard for AB pain- Take 1-2 capsules once a day for maintence or twice a day during a flare ?Please try to decrease stress. ?if any worsening symptoms like blood in stool, weight loss, please call the office or go to the ER.  ? ?Irritable Bowel Syndrome, Adult ?Irritable bowel syndrome (IBS) is a group of symptoms that affects the organs responsible for digestion (gastrointestinal or GI tract). IBS is not one specific disease. ?To regulate how the GI tract works, the body sends signals back and forth between the intestines and the brain. If you have IBS, there may be a problem with these signals. As a result, the GI tract does not function normally. The intestines may become more sensitive and overreact to certain things. This may be especially true when you eat certain foods or when you are under stress. ?There are four types of IBS. These may be determined based on the consistency of your stool (feces): ?IBS with diarrhea. ?IBS with constipation. ?Mixed IBS. ?Unsubtyped IBS. ?It is important to know which type of IBS you have. Certain treatments are more likely to be helpful for certain types of IBS. ?What are the causes? ?The exact cause of IBS is not known. ?What increases the risk? ?You may have a higher risk for IBS if you: ?Are female. ?Are younger than 46. ?Have a family history of IBS. ?Have a mental health condition, such as depression, anxiety, or post-traumatic stress disorder. ?Have had a bacterial infection of  your GI tract. ?What are the signs or symptoms? ?Symptoms of IBS vary from person to person. The main symptom is abdominal pain or discomfort. Other symptoms usually include one or more of the following: ?Diarrhea, constipation, or both. ?Abdominal swelling or bloating. ?Feeling full after eating a small or regular-sized meal. ?Frequent gas. ?Mucus in the stool. ?A feeling of having more stool left after a bowel movement. ?Symptoms tend to come and go. They may be triggered by stress, mental health conditions, or certain foods. ?How is this diagnosed? ?This condition may be diagnosed based on a physical exam, your medical history, and your symptoms. You may have tests, such as: ?Blood tests. ?Stool test. ?X-rays. ?CT scan. ?Colonoscopy. This is a procedure in which your GI tract is viewed with a long, thin, flexible tube. ?How is this treated? ?There is no cure for IBS, but treatment can help relieve symptoms. Treatment depends on the type of IBS you have, and may include: ?Changes to your diet, such as: ?Avoiding foods that cause symptoms. ?Drinking more water. ?Following a low-FODMAP (fermentable oligosaccharides, disaccharides, monosaccharides, and polyols) diet for up to 6 weeks, or as told by your health care provider. FODMAPs are sugars that are hard for some people to digest. ?Eating more fiber. ?Eating medium-sized meals at the same times every day. ?Medicines. These may include: ?Fiber supplements, if you have constipation. ?Medicine to control diarrhea (antidiarrheal medicines). ?Medicine to help control muscle tightening (spasms) in your GI tract (antispasmodic medicines). ?  Medicines to help with mental health conditions, such as antidepressants or tranquilizers. ?Talk therapy or counseling. ?Working with a diet and nutrition specialist (dietitian) to help create a food plan that is right for you. ?Managing your stress. ?Follow these instructions at home: ?Eating and drinking ?Eat a healthy diet. ?Eat  medium-sized meals at about the same time every day. Do not eat large meals. ?Gradually eat more fiber-rich foods. These include whole grains, fruits, and vegetables. This may be especially helpful if you have IBS with constipation. ?Eat a diet low in FODMAPs. ?Drink enough fluid to keep your urine pale yellow. ?Keep a journal of foods that seem to trigger symptoms. ?Avoid foods and drinks that: ?Contain added sugar. ?Make your symptoms worse. Dairy products, caffeinated drinks, and carbonated drinks can make symptoms worse for some people. ?General instructions ?Take over-the-counter and prescription medicines and supplements only as told by your health care provider. ?Get enough exercise. Do at least 150 minutes of moderate-intensity exercise each week. ?Manage your stress. Getting enough sleep and exercise can help you manage stress. ?Keep all follow-up visits as told by your health care provider and therapist. This is important. ?Alcohol Use ?Do not drink alcohol if: ?Your health care provider tells you not to drink. ?You are pregnant, may be pregnant, or are planning to become pregnant. ?If you drink alcohol, limit how much you have: ?0-1 drink a day for women. ?0-2 drinks a day for men. ?Be aware of how much alcohol is in your drink. In the [place] one drink equals one typical bottle of beer (12 oz), one-half glass of wine (5 oz), or one shot of hard liquor (1? oz). ?Contact a health care provider if you have: ?Constant pain. ?Weight loss. ?Difficulty or pain when swallowing. ?Diarrhea that gets worse. ? ? ? ?Vitamin B12 Deficiency ?Vitamin B12 deficiency occurs when the body does not have enough vitamin B12, which is an important vitamin. The body needs this vitamin: ?To make red blood cells. ?To make DNA. This is the genetic material inside cells. ?To help the nerves work properly so they can carry messages from the brain to the body. ?Vitamin B12 deficiency can cause various health problems, such as a low red  blood cell count (anemia) or nerve damage. ?What are the causes? ?This condition may be caused by: ?Not eating enough foods that contain vitamin B12. ?Not having enough stomach acid and digestive fluids to properly absorb vitamin B12 from the food that you eat. ?Certain digestive system diseases that make it hard to absorb vitamin B12. These diseases include Crohn's disease, chronic pancreatitis, and cystic fibrosis. ?A condition in which the body does not make enough of a protein (intrinsic factor), resulting in too few red blood cells (pernicious anemia). ?Having a surgery in which part of the stomach or small intestine is removed. ?Taking certain medicines that make it hard for the body to absorb vitamin B12. These medicines include: ?Heartburn medicines (antacids and proton pump inhibitors). ?Certain antibiotic medicines. ?Some medicines that are used to treat diabetes, tuberculosis, gout, or high cholesterol. ?What increases the risk? ?The following factors may make you more likely to develop a B12 deficiency: ?Being older than age 1. ?Eating a vegetarian or vegan diet, especially while you are pregnant. ?Eating a poor diet while you are pregnant. ?Taking certain medicines. ?Having alcoholism. ?What are the signs or symptoms? ?In some cases, there are no symptoms of this condition. If the condition leads to anemia or nerve damage, various symptoms can occur,  such as: ?Weakness. ?Fatigue. ?Loss of appetite. ?Weight loss. ?Numbness or tingling in your hands and feet. ?Redness and burning of the tongue. ?Confusion or memory problems. ?Depression. ?Sensory problems, such as color blindness, ringing in the ears, or loss of taste. ?Diarrhea or constipation. ?Trouble walking. ?If anemia is severe, symptoms can include: ?Shortness of breath. ?Dizziness. ?Rapid heart rate (tachycardia). ?How is this diagnosed? ?This condition may be diagnosed with a blood test to measure the level of vitamin B12 in your blood. You may  also have other tests, including: ?A group of tests that measure certain characteristics of blood cells (complete blood count, CBC). ?A blood test to measure intrinsic factor. ?A procedure where a thin tu

## 2021-08-27 NOTE — Progress Notes (Signed)
Assessment and plans reviewed  

## 2021-08-28 DIAGNOSIS — H2513 Age-related nuclear cataract, bilateral: Secondary | ICD-10-CM | POA: Diagnosis not present

## 2021-08-28 DIAGNOSIS — H43811 Vitreous degeneration, right eye: Secondary | ICD-10-CM | POA: Diagnosis not present

## 2021-08-28 DIAGNOSIS — H353131 Nonexudative age-related macular degeneration, bilateral, early dry stage: Secondary | ICD-10-CM | POA: Diagnosis not present

## 2021-08-28 DIAGNOSIS — H04123 Dry eye syndrome of bilateral lacrimal glands: Secondary | ICD-10-CM | POA: Diagnosis not present

## 2021-09-04 DIAGNOSIS — R051 Acute cough: Secondary | ICD-10-CM | POA: Diagnosis not present

## 2021-09-04 DIAGNOSIS — Z20822 Contact with and (suspected) exposure to covid-19: Secondary | ICD-10-CM | POA: Diagnosis not present

## 2021-09-04 DIAGNOSIS — R059 Cough, unspecified: Secondary | ICD-10-CM | POA: Diagnosis not present

## 2021-09-05 DIAGNOSIS — M7582 Other shoulder lesions, left shoulder: Secondary | ICD-10-CM | POA: Diagnosis not present

## 2021-09-05 DIAGNOSIS — M5412 Radiculopathy, cervical region: Secondary | ICD-10-CM | POA: Diagnosis not present

## 2021-09-14 DIAGNOSIS — Z20822 Contact with and (suspected) exposure to covid-19: Secondary | ICD-10-CM | POA: Diagnosis not present

## 2021-09-18 DIAGNOSIS — S46012D Strain of muscle(s) and tendon(s) of the rotator cuff of left shoulder, subsequent encounter: Secondary | ICD-10-CM | POA: Diagnosis not present

## 2021-09-18 DIAGNOSIS — M6281 Muscle weakness (generalized): Secondary | ICD-10-CM | POA: Diagnosis not present

## 2021-09-18 DIAGNOSIS — M25612 Stiffness of left shoulder, not elsewhere classified: Secondary | ICD-10-CM | POA: Diagnosis not present

## 2021-09-25 DIAGNOSIS — Z20822 Contact with and (suspected) exposure to covid-19: Secondary | ICD-10-CM | POA: Diagnosis not present

## 2021-09-30 DIAGNOSIS — Z20822 Contact with and (suspected) exposure to covid-19: Secondary | ICD-10-CM | POA: Diagnosis not present

## 2021-10-07 DIAGNOSIS — Z20822 Contact with and (suspected) exposure to covid-19: Secondary | ICD-10-CM | POA: Diagnosis not present

## 2021-12-11 ENCOUNTER — Telehealth: Payer: Self-pay

## 2021-12-11 ENCOUNTER — Encounter: Payer: Self-pay | Admitting: Internal Medicine

## 2021-12-11 MED ORDER — HYOSCYAMINE SULFATE 0.125 MG SL SUBL: 0.1250 mg | SUBLINGUAL_TABLET | Freq: Two times a day (BID) | SUBLINGUAL | 3 refills | Status: DC | PRN

## 2021-12-11 NOTE — Telephone Encounter (Signed)
Refilled Levsin per patient's request.

## 2022-01-08 DIAGNOSIS — Z1231 Encounter for screening mammogram for malignant neoplasm of breast: Secondary | ICD-10-CM | POA: Diagnosis not present

## 2022-01-10 ENCOUNTER — Encounter: Payer: Self-pay | Admitting: Obstetrics & Gynecology

## 2022-01-21 DIAGNOSIS — Z03818 Encounter for observation for suspected exposure to other biological agents ruled out: Secondary | ICD-10-CM | POA: Diagnosis not present

## 2022-01-21 DIAGNOSIS — R0989 Other specified symptoms and signs involving the circulatory and respiratory systems: Secondary | ICD-10-CM | POA: Diagnosis not present

## 2022-01-21 DIAGNOSIS — J069 Acute upper respiratory infection, unspecified: Secondary | ICD-10-CM | POA: Diagnosis not present

## 2022-01-27 ENCOUNTER — Ambulatory Visit (INDEPENDENT_AMBULATORY_CARE_PROVIDER_SITE_OTHER): Payer: Medicare Other | Admitting: Obstetrics & Gynecology

## 2022-01-27 ENCOUNTER — Encounter: Payer: Self-pay | Admitting: Obstetrics & Gynecology

## 2022-01-27 VITALS — BP 134/80

## 2022-01-27 DIAGNOSIS — R300 Dysuria: Secondary | ICD-10-CM

## 2022-01-27 DIAGNOSIS — N898 Other specified noninflammatory disorders of vagina: Secondary | ICD-10-CM | POA: Diagnosis not present

## 2022-01-27 LAB — WET PREP FOR TRICH, YEAST, CLUE

## 2022-01-27 MED ORDER — SULFAMETHOXAZOLE-TRIMETHOPRIM 800-160 MG PO TABS: 1.0000 | ORAL_TABLET | Freq: Two times a day (BID) | ORAL | 0 refills | Status: AC

## 2022-01-27 MED ORDER — TINIDAZOLE 500 MG PO TABS: 1000.0000 mg | ORAL_TABLET | Freq: Two times a day (BID) | ORAL | 0 refills | Status: AC

## 2022-01-27 MED ORDER — FLUCONAZOLE 150 MG PO TABS: 150.0000 mg | ORAL_TABLET | ORAL | 0 refills | Status: DC

## 2022-01-27 NOTE — Progress Notes (Signed)
    Sharon Fox July 15, 1950 [id number]        71 y.o.  G1P1001 Married  RP: Vaginal discharge x 3 days  HPI: Greenish vaginal discharge x 3 days.  Removed her Estring 2 days ago.  Urinary frequency.  Burning externally after urination.  No pelvic pain.  No fever.  Not worried about STI.   OB History  Gravida Para Term Preterm AB Living  '1 1 1 '$ 0 0 1  SAB IAB Ectopic Multiple Live Births  0 0 0   1    # Outcome Date GA Lbr Len/2nd Weight Sex Delivery Anes PTL Lv  1 Term 27    F CS-Unspec   LIV    Past medical history,surgical history, problem list, medications, allergies, family history and social history were all reviewed and documented in the EPIC chart.   Directed ROS with pertinent positives and negatives documented in the history of present illness/assessment and plan.  Exam:  Vitals:   01/27/22 1344  BP: 134/80   General appearance:  Normal  CVAT Neg bilaterally Abdomen: Normal Gynecologic exam: Vulva Mild rash.  Speculum:  Abundant greenish discharge. Cervix/Vagina normal. Wet prep done.  Gono-Chlam done.  U/A: Yellow, slightly cloudy, Pro Neg, Nit Neg, WBC 20-40, RBC 3-10, Bacteria few.  Wet prep: Clue cells, odor present   Assessment/Plan:  71 y.o. G1P1001   1. Dysuria U/A abnormal, probable Acute Cystitis.  Will treat with Bactrim DS.  Usage reviewed, prescription sent to pharmacy.  Pending U. Culture. - Urinalysis,Complete w/RFL Culture  2. Vaginal discharge Bacterial Vaginosis confirmed by wet prep.  Treat with Tinidazole.  Usage reviewed, prescription sent to pharmacy.  R/O Gono-Chlam. - WET PREP FOR TRICH, YEAST, CLUE - C. trachomatis/N. gonorrhoeae RNA  Other orders - Urine Culture - REFLEXIVE URINE CULTURE  - tinidazole (TINDAMAX) 500 MG tablet; Take 2 tablets (1,000 mg total) by mouth 2 (two) times daily for 2 days. - sulfamethoxazole-trimethoprim (BACTRIM DS) 800-160 MG tablet; Take 1 tablet by mouth 2 (two) times daily for 3 days. -  fluconazole (DIFLUCAN) 150 MG tablet; Take 1 tablet (150 mg total) by mouth every other day.   [name] MD, 2:13 PM 01/27/2022

## 2022-01-28 ENCOUNTER — Telehealth: Payer: Self-pay

## 2022-01-28 ENCOUNTER — Encounter: Payer: Self-pay | Admitting: Obstetrics & Gynecology

## 2022-01-28 LAB — C. TRACHOMATIS/N. GONORRHOEAE RNA
C. trachomatis RNA, TMA: NOT DETECTED
N. gonorrhoeae RNA, TMA: NOT DETECTED

## 2022-01-28 NOTE — Telephone Encounter (Signed)
Patient also sent My Chart message. See that for reply.

## 2022-01-28 NOTE — Telephone Encounter (Signed)
Visit yesterday. Received oral Rx's for Tindamax and Diflucan but inquiring if something topical Dr. Marguerita Merles can recommend that she apply externally as she is feeling very irritated on the vulva area.

## 2022-01-30 LAB — URINALYSIS, COMPLETE W/RFL CULTURE
Bilirubin Urine: NEGATIVE
Glucose, UA: NEGATIVE
Hyaline Cast: NONE SEEN /LPF
Ketones, ur: NEGATIVE
Nitrites, Initial: NEGATIVE
Protein, ur: NEGATIVE
Specific Gravity, Urine: 1.015 (ref 1.001–1.035)
pH: 7 (ref 5.0–8.0)

## 2022-01-30 LAB — CULTURE INDICATED

## 2022-01-30 LAB — URINE CULTURE
MICRO NUMBER:: 13839900
SPECIMEN QUALITY:: ADEQUATE

## 2022-01-31 NOTE — Telephone Encounter (Signed)
Dr.Lavoie replied "You can schedule her for a TOC visit next week. "

## 2022-02-04 ENCOUNTER — Encounter: Payer: Self-pay | Admitting: Obstetrics & Gynecology

## 2022-02-04 ENCOUNTER — Ambulatory Visit (INDEPENDENT_AMBULATORY_CARE_PROVIDER_SITE_OTHER): Payer: Medicare Other | Admitting: Obstetrics & Gynecology

## 2022-02-04 VITALS — BP 116/74 | HR 70 | Resp 16

## 2022-02-04 DIAGNOSIS — N760 Acute vaginitis: Secondary | ICD-10-CM | POA: Diagnosis not present

## 2022-02-04 DIAGNOSIS — Z87440 Personal history of urinary (tract) infections: Secondary | ICD-10-CM

## 2022-02-04 DIAGNOSIS — N898 Other specified noninflammatory disorders of vagina: Secondary | ICD-10-CM

## 2022-02-04 DIAGNOSIS — R8290 Unspecified abnormal findings in urine: Secondary | ICD-10-CM | POA: Diagnosis not present

## 2022-02-04 LAB — WET PREP FOR TRICH, YEAST, CLUE

## 2022-02-04 MED ORDER — METRONIDAZOLE 0.75 % VA GEL: 1.0000 | Freq: Every day | VAGINAL | 0 refills | Status: AC

## 2022-02-04 NOTE — Progress Notes (Addendum)
    Sharon Fox 1950/10/10 [id number]        71 y.o.  G1P1L1   RP: Improved, but still having a vaginal discharge   HPI: Had BV treated with Tinidazole on 8/28th.  Also treated an Acute Cystitis to E. Coli with Bactrim DS x 3 days.  Sensitive to Bactrim.  Took Fluconazole x 2 after the ABTxs.  Currently much improved vaginal discharge, but still some.  No UTI Sx.  No pelvic pain.  Vulvar irritation also improved with Clobetasol as needed.   OB History  Gravida Para Term Preterm AB Living  '1 1 1 '$ 0 0 1  SAB IAB Ectopic Multiple Live Births  0 0 0   1    # Outcome Date GA Lbr Len/2nd Weight Sex Delivery Anes PTL Lv  1 Term 62    F CS-Unspec   LIV    Past medical history,surgical history, problem list, medications, allergies, family history and social history were all reviewed and documented in the EPIC chart.   Directed ROS with pertinent positives and negatives documented in the history of present illness/assessment and plan.  Exam:  Vitals:   02/04/22 1044  BP: 116/74  Pulse: 70  Resp: 16   General appearance:  Normal  Abdomen: Normal  Gynecologic exam: Vulva Minimal erythema.  Speculum:  Cervix/vagina normal.  Light vaginal discharge.  Wet prep done.  Bimanual exam:  Uterus AV, normal volume, mobile, NT.  No adnexal mass, NT.  Wet prep:  Some Clue cells, no odor. U/A: Yellow clear, Pro Neg, Nit Neg, WBC 6-10, RBC 10-20, Bacteria few.  U. Culture pending.   Assessment/Plan:  71 y.o. G1P1001   1. Vaginal discharge Had BV treated with Tinidazole on 8/28th.  Also treated an Acute Cystitis to E. Coli with Bactrim DS x 3 days.  Sensitive to Bactrim.  Took Fluconazole x 2 after the ABTxs.  Currently much improved vaginal discharge, but still some.  No UTI Sx.  No pelvic pain.  Vulvar irritation also improved with Clobetasol as needed.  Mild BV per Wet Prep.  Will treat with Metronidazole gel.  Usage reviewed, prescription sent to pharmacy.  Will take her left over  Fluconazole after the ABTx. - WET PREP FOR [place], YEAST, CLUE  2. History of UTI U/A mild abnormalities, will wait on U. Culture results to decide if treatment is needed. - Urinalysis,Complete w/RFL Culture  Other orders - benzonatate (TESSALON) 100 MG capsule; Take 100 mg by mouth 3 (three) times daily as needed. - meloxicam (MOBIC) 15 MG tablet; Take 15 mg by mouth daily. - metroNIDAZOLE (METROGEL) 0.75 % vaginal gel; Place 1 Applicatorful vaginally at bedtime for 7 days.   [name] MD, 10:57 AM 02/04/2022

## 2022-02-05 ENCOUNTER — Encounter: Payer: Self-pay | Admitting: Obstetrics & Gynecology

## 2022-02-06 ENCOUNTER — Telehealth: Payer: Self-pay

## 2022-02-06 ENCOUNTER — Other Ambulatory Visit: Payer: Self-pay

## 2022-02-06 LAB — URINALYSIS, COMPLETE W/RFL CULTURE
Bilirubin Urine: NEGATIVE
Casts: NONE SEEN /LPF
Crystals: NONE SEEN /HPF
Glucose, UA: NEGATIVE
Hyaline Cast: NONE SEEN /LPF
Ketones, ur: NEGATIVE
Nitrites, Initial: NEGATIVE
Protein, ur: NEGATIVE
Specific Gravity, Urine: 1.01 (ref 1.001–1.035)
Yeast: NONE SEEN /HPF
pH: 5.5 (ref 5.0–8.0)

## 2022-02-06 LAB — CULTURE INDICATED

## 2022-02-06 LAB — URINE CULTURE
MICRO NUMBER:: 13871981
SPECIMEN QUALITY:: ADEQUATE

## 2022-02-06 MED ORDER — TINIDAZOLE 500 MG PO TABS: ORAL_TABLET | ORAL | 0 refills | Status: DC

## 2022-02-06 NOTE — Telephone Encounter (Signed)
Patient was seen in office 02/04/22. She has been using the Metrogel Vaginal but vulva and vagina feels more irritated using this. She said you had offered oral Rx but she chose gel. She thinks now she would like to d/c the gel and get oral Rx.  Also, she asked about getting an appointment with you tomorrow. She said she has been having some coughing issues lately and last night after a really big coughing fit she hurt in her side really low. She said it hurts with movement and when she sat up in bed. She has been up and about today and thinks it is better but still worried about it.  Please advise as  I do not see opening on your schedule for tomorrow.

## 2022-02-06 NOTE — Telephone Encounter (Signed)
Patient contacted me in My Chart to let me know appt desk called her and scheduled her for 1:30pm tomorrow.  I gave her Dr. Mariah Milling recommendations on that message as well. See My Chart message 02/06/22.

## 2022-02-07 ENCOUNTER — Ambulatory Visit: Payer: Medicare Other | Admitting: Obstetrics & Gynecology

## 2022-02-13 ENCOUNTER — Ambulatory Visit (INDEPENDENT_AMBULATORY_CARE_PROVIDER_SITE_OTHER): Payer: Medicare Other | Admitting: Obstetrics & Gynecology

## 2022-02-13 ENCOUNTER — Other Ambulatory Visit: Payer: Self-pay | Admitting: *Deleted

## 2022-02-13 ENCOUNTER — Encounter: Payer: Self-pay | Admitting: Obstetrics & Gynecology

## 2022-02-13 ENCOUNTER — Ambulatory Visit (INDEPENDENT_AMBULATORY_CARE_PROVIDER_SITE_OTHER): Payer: Medicare Other

## 2022-02-13 ENCOUNTER — Other Ambulatory Visit (HOSPITAL_COMMUNITY)
Admission: RE | Admit: 2022-02-13 | Discharge: 2022-02-13 | Disposition: A | Discharge status: Home / Self Care | Payer: Medicare Other | Source: Ambulatory Visit | Attending: Obstetrics & Gynecology | Admitting: Obstetrics & Gynecology

## 2022-02-13 VITALS — BP 120/80 | HR 79

## 2022-02-13 DIAGNOSIS — D3912 Neoplasm of uncertain behavior of left ovary: Secondary | ICD-10-CM

## 2022-02-13 DIAGNOSIS — N83202 Unspecified ovarian cyst, left side: Secondary | ICD-10-CM | POA: Diagnosis not present

## 2022-02-13 DIAGNOSIS — N898 Other specified noninflammatory disorders of vagina: Secondary | ICD-10-CM

## 2022-02-13 DIAGNOSIS — Z124 Encounter for screening for malignant neoplasm of cervix: Secondary | ICD-10-CM | POA: Diagnosis not present

## 2022-02-13 LAB — WET PREP FOR TRICH, YEAST, CLUE

## 2022-02-13 NOTE — Progress Notes (Unsigned)
    Sharon Fox 1951-04-11 [id number]        71 y.o.  G1P1L1   RP: Persistent greenish vaginal discharge  HPI: Persistent greenish vaginal discharge.  Had Bacterial Vaginosis on Wet prep x 2 recently, last 02/04/22 treated with Tinidazole.  No improvement after treatment.  No current pelvic pain.  Last Pap Neg in 2020.  Gono-Chlam Neg 01/27/22.  U. Culture Neg 02/04/22.  No UTI Sx currently.  No fever.   OB History  Gravida Para Term Preterm AB Living  '1 1 1 '$ 0 0 1  SAB IAB Ectopic Multiple Live Births  0 0 0   1    # Outcome Date GA Lbr Len/2nd Weight Sex Delivery Anes PTL Lv  1 Term 33    F CS-Unspec   LIV    Past medical history,surgical history, problem list, medications, allergies, family history and social history were all reviewed and documented in the EPIC chart.   Directed ROS with pertinent positives and negatives documented in the history of present illness/assessment and plan.  Exam:  Vitals:   02/13/22 1112  BP: 120/80  Pulse: 79  SpO2: 98%   General appearance:  Normal  Gynecologic exam: Vulva with mild irritation/erythema.  Speculum:  Cervix normal, but very very small Exocervix.  Vagina normal.  Mild vaginal discharge present, but bled with insertion of the speculum.  Wet prep done.  Pap reflex done.  Wet prep:  Negative except presence of blood.  Written consent obtained for  Endometrial Biopsy.    Procedure: Speculum:  Betadine prep, Hurricane spray of the cervix.  Tenaculum on the anterior lip of the cervix.  Stenotic cervix.  Os finder not entering the canal.  Smallest cervical dilator entered at the external os, but not further.  Decision not to further attempt overcoming the cervical stenosis and proceed with a Pelvic US today.  Tenaculum/speculum removed.  No Cx.  Post procedure precautions.  Pelvic US today: T/V images.  Anteverted irregular uterus with several calcified subserous fibroids.  The overall uterine size is measured at 6.12 x 3.01 x  2.71 cm.  4 fibroids are measured from 2.11 cm the endometrial lining is thin and symmetrical at 2.1 mm with no mass seen.  The right ovary is small with atrophic appearance.  The left adnexa shows an avascular cystic mass with debris's measured at 4.1 x 2.4 x 2.4 cm.  This structure is documented both transvaginally and transabdominally.  No free fluid in the pelvis.  Wet prep Negative   Assessment/Plan:  71 y.o. G1P1001   1. Vaginal discharge Wet prep Negative.  Attempted EBx, but unsuccessful d/t cervical stenosis.  Pelvic US performed showing a thin normal endometrial line at 2.1 mm.   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  This structure is documented both transvaginally and transabdominally.  No free fluid in the pelvis.  Ca 125 pending.  Schedule Pelvic MRI to further investigate that Ovarian mass given patient's age of 71.  Patient voiced understanding and agreement with the plan. - CA 125  Other orders - nystatin-triamcinolone (MYCOLOG II) cream; Apply topically 2 (two) times daily as needed.   [name] MD, 11:23 AM 02/13/2022

## 2022-02-14 ENCOUNTER — Telehealth: Payer: Self-pay

## 2022-02-14 DIAGNOSIS — N83202 Unspecified ovarian cyst, left side: Secondary | ICD-10-CM

## 2022-02-14 DIAGNOSIS — D2271 Melanocytic nevi of right lower limb, including hip: Secondary | ICD-10-CM | POA: Diagnosis not present

## 2022-02-14 DIAGNOSIS — L821 Other seborrheic keratosis: Secondary | ICD-10-CM | POA: Diagnosis not present

## 2022-02-14 DIAGNOSIS — D3912 Neoplasm of uncertain behavior of left ovary: Secondary | ICD-10-CM

## 2022-02-14 DIAGNOSIS — D239 Other benign neoplasm of skin, unspecified: Secondary | ICD-10-CM | POA: Diagnosis not present

## 2022-02-14 DIAGNOSIS — Z411 Encounter for cosmetic surgery: Secondary | ICD-10-CM | POA: Diagnosis not present

## 2022-02-14 DIAGNOSIS — D225 Melanocytic nevi of trunk: Secondary | ICD-10-CM | POA: Diagnosis not present

## 2022-02-14 DIAGNOSIS — D2261 Melanocytic nevi of right upper limb, including shoulder: Secondary | ICD-10-CM | POA: Diagnosis not present

## 2022-02-14 DIAGNOSIS — L720 Epidermal cyst: Secondary | ICD-10-CM | POA: Diagnosis not present

## 2022-02-14 DIAGNOSIS — L578 Other skin changes due to chronic exposure to nonionizing radiation: Secondary | ICD-10-CM | POA: Diagnosis not present

## 2022-02-14 LAB — CA 125: CA 125: 12 U/mL (ref <35)

## 2022-02-14 NOTE — Telephone Encounter (Signed)
-----   Message from [name], MD sent at 02/14/2022  8:45 AM EDT ----- Regarding: Schedule Pelvic MRI Pelvic US 02/13/22 showed a Left Cystic Mass with debris 4.1 x 2.4 x 2.4 cm in a 71 yo woman.  Schedule Pelvic MRI.

## 2022-02-14 NOTE — Telephone Encounter (Signed)
Order sent by Dr. Dellis Filbert. Refer to telephone encounter 02/14/22.

## 2022-02-17 ENCOUNTER — Encounter: Payer: Self-pay | Admitting: Obstetrics & Gynecology

## 2022-02-18 ENCOUNTER — Encounter: Payer: Self-pay | Admitting: Obstetrics & Gynecology

## 2022-02-19 LAB — CYTOLOGY - PAP: Diagnosis: NEGATIVE

## 2022-02-19 NOTE — Telephone Encounter (Signed)
Pt scheduled for 03/03/2022.

## 2022-03-03 ENCOUNTER — Ambulatory Visit
Admission: RE | Admit: 2022-03-03 | Discharge: 2022-03-03 | Disposition: A | Discharge status: Home / Self Care | Payer: Medicare Other | Source: Ambulatory Visit | Attending: Obstetrics & Gynecology | Admitting: Obstetrics & Gynecology

## 2022-03-03 ENCOUNTER — Encounter: Payer: Self-pay | Admitting: Obstetrics & Gynecology

## 2022-03-03 DIAGNOSIS — N83202 Unspecified ovarian cyst, left side: Secondary | ICD-10-CM | POA: Diagnosis not present

## 2022-03-03 DIAGNOSIS — D3912 Neoplasm of uncertain behavior of left ovary: Secondary | ICD-10-CM

## 2022-03-03 MED ORDER — GADOPICLENOL 0.5 MMOL/ML IV SOLN
7.0000 mL | Freq: Once | INTRAVENOUS | Status: AC | PRN
  Administered 2022-03-03: 7 mL via INTRAVENOUS

## 2022-03-04 ENCOUNTER — Telehealth: Payer: Self-pay | Admitting: Internal Medicine

## 2022-03-04 NOTE — Telephone Encounter (Signed)
Pt has OV scheduled with Dr. Henrene Pastor 03/12/22 for an anal fistula. Please see note below and advise.

## 2022-03-04 NOTE — Telephone Encounter (Signed)
Please contact the patient.  This is a fistula or fissure? Who diagnosed it?  What are the symptoms?  How long have they been present?  What is she doing for this problem?

## 2022-03-04 NOTE — Telephone Encounter (Signed)
Inbound call from Dr. Rachelle Hora requesting that patient be seen sooner than 10/11 with Dr. Henrene Pastor. If possible he is requesting a call to patient to inform her. Please advise.

## 2022-03-04 NOTE — Telephone Encounter (Signed)
Thank you for the information.  Colovesical fistula is a surgical problem.  She will not need a colonoscopy, as she just had 1 last year. She should follow-up with her GYN.  No need for GI appointment, but thanks for checking.

## 2022-03-04 NOTE — Telephone Encounter (Signed)
Spoke with pt and her GYN, Dr. Dellis Filbert, ordered the MRI that was done yesterday. She has not been able to talk to her GYN doc yet as she is out of town. She reports she is having some discomfort in her back and along the front where the fistula is located. Pt states since it appeared to be GI related she thought she needed to see Dr. Henrene Pastor.  IMPRESSION: Several small uterine fibroids measuring up to 1.7 cm.   Sigmoid diverticular disease, with signs of colovaginal fistula.   4.4 cm benign-appearing cystic lesion in the left adnexa, which shows some signs of tethering to the adjacent sigmoid colon. Differential diagnosis includes benign ovarian cyst, paraovarian cyst, or pericolonic fluid collection related to prior diverticulitis.    Please advise.

## 2022-03-05 ENCOUNTER — Telehealth: Payer: Self-pay

## 2022-03-05 NOTE — Telephone Encounter (Signed)
Patient called again. Very anxious about result.

## 2022-03-05 NOTE — Telephone Encounter (Signed)
Patient calling because she wants to know about her MRI and what next step will be. She checked with her GI MD yesterday because she knew ML out and he told her this fistula will require surgery.  She said she is upset and would like to talk with someone about it prior to Dr. Mariah Milling return to see what will be recommended.

## 2022-03-05 NOTE — Telephone Encounter (Signed)
Spoke with pt and she is aware of Dr. Blanch Media recommendations. OV appt with Dr. Henrene Pastor cancelled and she is aware.

## 2022-03-05 NOTE — Telephone Encounter (Signed)
Phone call to patient to review patient's MRI in Dr. Assunta Curtis absence.   Patient has a simple left ovarian cyst 4.4 x 2.4 x 2.3 cm with tethering to the sigmoid colon and a colovaginal fistula.   Several small fibroids were also noted.  No evidence of diverticulitis.  No urinary bladder or urethral abnormality identified.   Her CA125 was 12 on 02/13/22.  Patient has already reached out to her GI team and has an appointment to see her gastroenterologist next week.   I told her I would contact Dr. Valarie Cones of [facility] tomorrow regarding potential consultation with her or with a colon and rectal surgeon here in [place].   Patient would like to have contact again tomorrow for an update.

## 2022-03-06 ENCOUNTER — Other Ambulatory Visit: Payer: Self-pay

## 2022-03-06 NOTE — Telephone Encounter (Signed)
-----   Message from Nunzio Cobbs, MD sent at 03/06/2022  2:03 PM EDT ----- Regarding: RE: colovaginal fistula I will make the referral to you to Dr. Nadeen Landau.   Thank you for helping Dr. Assunta Curtis patient.   Josefa Half ----- Message ----- From: Lafonda Mosses, MD Sent: 03/06/2022   1:45 PM EDT To: Demauri Advincula Oletta Lamas, MD Subject: RE: colovaginal fistula                        Hi Kele Withem, I think its probably going to require multi-disciplinary care. I'd recommend referring her to one of the colorectal surgeons at [facility] and to me.  Best, kat ----- Message ----- From: Nunzio Cobbs, MD Sent: 03/06/2022  11:26 AM EDT To: Lafonda Mosses, MD Subject: colovaginal fistula                            Hi Dr. Berline Lopes,   I have been asked to assist in the care of this patient, while her primary GYN, Dr. Dellis Filbert is out of the office.   Ms. Agostinelli is a 71 year old G79P1 Caucasian female who had a pelvic MRI done on 03/03/22 for evaluation of a pelvic mass.  She has also been having vaginal discharge. Findings of the MRI are a simple left ovarian cyst 4.4 x 2.4 x 2.3 cm with tethering to the sigmoid colon and a colovaginal fistula.  Several small fibroids were also noted.  No evidence of diverticulitis.  No urinary bladder or urethral abnormality identified.   Her CA125 was 12 on 02/13/22.  The patient has an appointment with her gastroenterologist next week at [facility].   I recommended specialist surgical consultation to her in Dr. Assunta Curtis absence.  Would this be an appropriate referral to you or to a colon and rectal surgeon?  The patient is quiet anxious to proceed forward with her care.   Thank you for your assistance.   Josefa Half, MD

## 2022-03-06 NOTE — Telephone Encounter (Signed)
I called patient to relay the below note and she informed me that she is scheduled with a Gyn-oncologist in [place] already next week on 03/13/22 but she couldn't remember the name. She declined both referrals at this time reports she will follow up with the gyn-oncologist next week and if she feels colon rectal surgeon is needed she will proceed.  Patient asked if I could send a my chart message of the names of the providers that Dr.Silva recommended. My chart message sent I also asked her to let me know the name of the provider she will see next week.  Encounter routed to [facility] and Dr.Lavoie to update.

## 2022-03-06 NOTE — Telephone Encounter (Signed)
Please contact patient in follow up to the message received from Dr. Berline Lopes.   Please make a referral to Dr. Berline Lopes of [facility] and to Dr. Nadeen Landau of [facility].   Cc - Dr. Dellis Filbert.

## 2022-03-06 NOTE — Telephone Encounter (Signed)
Thank you for the update!

## 2022-03-12 ENCOUNTER — Ambulatory Visit: Payer: Medicare Other | Admitting: Internal Medicine

## 2022-03-13 DIAGNOSIS — N824 Other female intestinal-genital tract fistulae: Secondary | ICD-10-CM | POA: Diagnosis not present

## 2022-03-13 DIAGNOSIS — Z6826 Body mass index (BMI) 26.0-26.9, adult: Secondary | ICD-10-CM | POA: Diagnosis not present

## 2022-03-21 DIAGNOSIS — Z23 Encounter for immunization: Secondary | ICD-10-CM | POA: Diagnosis not present

## 2022-03-28 NOTE — Telephone Encounter (Signed)
MRI completed and results reviewed by provider.

## 2022-04-21 DIAGNOSIS — N824 Other female intestinal-genital tract fistulae: Secondary | ICD-10-CM | POA: Diagnosis not present

## 2022-04-30 DIAGNOSIS — Z23 Encounter for immunization: Secondary | ICD-10-CM | POA: Diagnosis not present

## 2022-05-06 DIAGNOSIS — D219 Benign neoplasm of connective and other soft tissue, unspecified: Secondary | ICD-10-CM | POA: Diagnosis not present

## 2022-05-06 DIAGNOSIS — N321 Vesicointestinal fistula: Secondary | ICD-10-CM | POA: Diagnosis not present

## 2022-05-06 DIAGNOSIS — K589 Irritable bowel syndrome without diarrhea: Secondary | ICD-10-CM | POA: Diagnosis not present

## 2022-05-06 DIAGNOSIS — K5792 Diverticulitis of intestine, part unspecified, without perforation or abscess without bleeding: Secondary | ICD-10-CM | POA: Diagnosis not present

## 2022-05-06 DIAGNOSIS — N823 Fistula of vagina to large intestine: Secondary | ICD-10-CM | POA: Diagnosis not present

## 2022-05-06 DIAGNOSIS — N838 Other noninflammatory disorders of ovary, fallopian tube and broad ligament: Secondary | ICD-10-CM | POA: Diagnosis not present

## 2022-05-06 DIAGNOSIS — N824 Other female intestinal-genital tract fistulae: Secondary | ICD-10-CM | POA: Diagnosis not present

## 2022-05-06 DIAGNOSIS — D62 Acute posthemorrhagic anemia: Secondary | ICD-10-CM | POA: Diagnosis not present

## 2022-05-06 DIAGNOSIS — Z780 Asymptomatic menopausal state: Secondary | ICD-10-CM | POA: Diagnosis not present

## 2022-05-06 DIAGNOSIS — H9190 Unspecified hearing loss, unspecified ear: Secondary | ICD-10-CM | POA: Diagnosis not present

## 2022-05-06 DIAGNOSIS — K5732 Diverticulitis of large intestine without perforation or abscess without bleeding: Secondary | ICD-10-CM | POA: Diagnosis not present

## 2022-05-06 DIAGNOSIS — N83202 Unspecified ovarian cyst, left side: Secondary | ICD-10-CM | POA: Diagnosis not present

## 2022-05-06 DIAGNOSIS — K5790 Diverticulosis of intestine, part unspecified, without perforation or abscess without bleeding: Secondary | ICD-10-CM | POA: Diagnosis not present

## 2022-05-06 HISTORY — PX: FISTULOTOMY: SHX6413

## 2022-05-06 HISTORY — PX: BILATERAL OOPHORECTOMY: SHX1221

## 2022-05-19 DIAGNOSIS — N824 Other female intestinal-genital tract fistulae: Secondary | ICD-10-CM | POA: Diagnosis not present

## 2022-05-19 DIAGNOSIS — Z09 Encounter for follow-up examination after completed treatment for conditions other than malignant neoplasm: Secondary | ICD-10-CM | POA: Diagnosis not present

## 2022-06-30 ENCOUNTER — Ambulatory Visit (INDEPENDENT_AMBULATORY_CARE_PROVIDER_SITE_OTHER): Payer: Medicare Other | Admitting: Obstetrics & Gynecology

## 2022-06-30 ENCOUNTER — Encounter: Payer: Self-pay | Admitting: Obstetrics & Gynecology

## 2022-06-30 VITALS — BP 120/84 | HR 91 | Wt 144.0 lb

## 2022-06-30 DIAGNOSIS — N952 Postmenopausal atrophic vaginitis: Secondary | ICD-10-CM | POA: Diagnosis not present

## 2022-06-30 MED ORDER — ESTRADIOL 10 MCG VA TABS: 1.0000 | ORAL_TABLET | VAGINAL | 4 refills | Status: DC

## 2022-06-30 NOTE — Progress Notes (Signed)
    Sharon Fox 05/17/1951 [id number]        72 y.o.  G1P1001   RP: Vaginal dryness  HPI: Robotic sigmoid colectomy with colovaginal fistula repair and bilateral salpingo-oophorectomy on 05/06/22.  Good recovery.  Was using Estring.  Not able to insert it now.  C/O vaginal dryness.  Would like to resume sexual activity.      OB History  Gravida Para Term Preterm AB Living  '1 1 1 '$ 0 0 1  SAB IAB Ectopic Multiple Live Births  0 0 0   1    # Outcome Date GA Lbr Len/2nd Weight Sex Delivery Anes PTL Lv  1 Term 56    F CS-Unspec   LIV    Past medical history,surgical history, problem list, medications, allergies, family history and social history were all reviewed and documented in the EPIC chart.   Directed ROS with pertinent positives and negatives documented in the history of present illness/assessment and plan.  Exam:  Vitals:   06/30/22 1346  BP: 120/84  Pulse: 91  SpO2: 97%  Weight: 144 lb (65.3 kg)   General appearance:  Normal  Gynecologic exam: Deferred   Assessment/Plan:  72 y.o. G1P1001   1. Post-menopausal atrophic vaginitis Robotic sigmoid colectomy with colovaginal fistula repair and bilateral salpingo-oophorectomy on 05/06/22.  Good recovery.  Was using Estring.  Not able to insert it now.  C/O vaginal dryness.  Would like to resume sexual activity.   After counseling, decision to start on Vagifem vaginally twice a week.  Usage reviewed and prescription sent to pharmacy.  Recommend using a moisturizer OTC such as Replens and Coconut oil as needed when sexually active.  Other orders - tretinoin (RETIN-A) 6.712 % cream; 1 application in the evening to face Externally FOR COSMETIC USE, INSURANCE WILL NOT COVERE Once a day for 30 days (Patient not taking: Reported on 06/30/2022) - triamcinolone ointment (KENALOG) 0.1 %; 1 application Externally Twice a day for 7 days (Patient not taking: Reported on 06/30/2022) - Estradiol 10 MCG TABS vaginal tablet; Place 1  tablet (10 mcg total) vaginally 2 (two) times a week.   [name] MD, 2:14 PM 06/30/2022

## 2022-09-11 ENCOUNTER — Encounter: Payer: Self-pay | Admitting: Obstetrics & Gynecology

## 2022-09-11 ENCOUNTER — Ambulatory Visit (INDEPENDENT_AMBULATORY_CARE_PROVIDER_SITE_OTHER): Payer: Medicare Other | Admitting: Obstetrics & Gynecology

## 2022-09-11 VITALS — BP 114/70 | HR 82

## 2022-09-11 DIAGNOSIS — N952 Postmenopausal atrophic vaginitis: Secondary | ICD-10-CM

## 2022-09-11 DIAGNOSIS — N951 Menopausal and female climacteric states: Secondary | ICD-10-CM | POA: Diagnosis not present

## 2022-09-11 LAB — WET PREP FOR TRICH, YEAST, CLUE

## 2022-09-11 MED ORDER — HYOSCYAMINE SULFATE 0.125 MG SL SUBL: 0.1250 mg | SUBLINGUAL_TABLET | Freq: Two times a day (BID) | SUBLINGUAL | 0 refills | Status: DC | PRN

## 2022-09-11 MED ORDER — ESTRADIOL 0.1 MG/GM VA CREA: 1.0000 | TOPICAL_CREAM | VAGINAL | 4 refills | Status: DC

## 2022-09-11 NOTE — Progress Notes (Signed)
    Sharon Fox 24-Jan-1951 [id number]        72 y.o.  G1P1001   RP: Vaginal dryness  HPI: Robotic sigmoid colectomy with colovaginal fistula repair and bilateral salpingo-oophorectomy on 05/06/22.  Good recovery. Still having vaginal dryness in spite of Estring. Painful IC.   OB History  Gravida Para Term Preterm AB Living  1 1 1  0 0 1  SAB IAB Ectopic Multiple Live Births  0 0 0   1    # Outcome Date GA Lbr Len/2nd Weight Sex Delivery Anes PTL Lv  1 Term 42    F CS-Unspec   LIV    Past medical history,surgical history, problem list, medications, allergies, family history and social history were all reviewed and documented in the EPIC chart.   Directed ROS with pertinent positives and negatives documented in the history of present illness/assessment and plan.  Exam:  Vitals:   09/11/22 1345  BP: 114/70  Pulse: 82  SpO2: 98%   General appearance:  Normal  Gynecologic exam: Vulva normal.  Estring removed.  Normal vaginal secretions.  Speculum:  Vagina mucosa with erythema at pressure points from the Estring, but no ulceration.  Wet prep done.  Wet prep: Neg   Assessment/Plan:  72 y.o. G1P1001   1. Post-menopausal atrophic vaginitis Robotic sigmoid colectomy with colovaginal fistula repair and bilateral salpingo-oophorectomy on 05/06/22.  Good recovery. Still having vaginal dryness in spite of Estring. Painful IC. Irritation from Estring.  Will try Estradiol cream twice a week.  Continue with coconut oil and add Astroglide as needed.  Recommend vaginal molds with coconut oil/Astroglide to practice and keep her vagina more compliant.  2. Vaginal dryness, menopausal Wet prep Neg, reassured. - WET PREP FOR TRICH, YEAST, CLUE  Other orders - estradiol (ESTRACE) 0.1 MG/GM vaginal cream; Place 1 Applicatorful vaginally 2 (two) times a week. - hyoscyamine (LEVSIN SL) 0.125 MG SL tablet; Place 1 tablet (0.125 mg total) under the tongue 2 (two) times daily as needed.    Genia Del MD, 1:47 PM 09/11/2022

## 2022-09-12 ENCOUNTER — Encounter: Payer: Self-pay | Admitting: Obstetrics & Gynecology

## 2022-10-01 DIAGNOSIS — L719 Rosacea, unspecified: Secondary | ICD-10-CM | POA: Diagnosis not present

## 2022-10-01 DIAGNOSIS — L821 Other seborrheic keratosis: Secondary | ICD-10-CM | POA: Diagnosis not present

## 2022-10-01 DIAGNOSIS — L309 Dermatitis, unspecified: Secondary | ICD-10-CM | POA: Diagnosis not present

## 2022-10-01 DIAGNOSIS — D2230 Melanocytic nevi of unspecified part of face: Secondary | ICD-10-CM | POA: Diagnosis not present

## 2022-10-01 DIAGNOSIS — H019 Unspecified inflammation of eyelid: Secondary | ICD-10-CM | POA: Diagnosis not present

## 2022-10-12 NOTE — Progress Notes (Deleted)
     10/12/2022 Sharon Fox [id number] Mar 15, 1951   Chief Complaint:  History of Present Illness: Sharon Fox is a 72 year old female with a past medical history of arthritis, hyperlipidemia, vitamin B 12 deficiency, IBS and colovesical fistula s/p sigmoid colectomy with colovaginal fistula repair and bilateral salpingo-oophorectomy 05/2022. She is known by Sharon Fox.     Latest Ref Rng & Units 07/18/2021    9:48 AM 11/21/2018    5:44 AM 11/20/2018   10:59 AM  CBC  WBC 4.0 - 10.5 K/uL 5.2  7.8  9.8   Hemoglobin 12.0 - 15.0 g/dL 40.9  81.1  91.4   Hematocrit 36.0 - 46.0 % 37.1  38.8  38.5   Platelets 150.0 - 400.0 K/uL 396.0  238  258        Latest Ref Rng & Units 07/18/2021    9:48 AM 12/12/2019    2:59 PM 11/21/2018    5:44 AM  CMP  Glucose 70 - 99 mg/dL 92   782   BUN 6 - 23 mg/dL 17   7   Creatinine 9.56 - 1.20 mg/dL 2.13   0.86   Sodium 578 - 145 mEq/L 139   139   Potassium 3.5 - 5.1 mEq/L 4.0   3.8   Chloride 96 - 112 mEq/L 103   109   CO2 19 - 32 mEq/L 33   23   Calcium 8.4 - 10.5 mg/dL 9.4  9.8  8.3   Total Protein 6.0 - 8.3 g/dL 7.5     Total Bilirubin 0.2 - 1.2 mg/dL 0.5     Alkaline Phos 39 - 117 U/L 66     AST 0 - 37 U/L 18     ALT 0 - 35 U/L 13       Colonoscopy 01/04/2021: - Diverticulosis in the entire examined colon. Rectosigmoid stenosis.  - The examination was otherwise normal on direct and retroflexion views.  - No specimens collected.  Past Medical History:  Diagnosis Date   Allergy    cipro and seasonal    Arthritis    Diverticulosis    DJD (degenerative joint disease)    Hyperlipidemia    IBS (irritable bowel syndrome)    Osteoarthritis    Shingles    Vertigo    Vitamin D deficiency        Current Medications, Allergies, Past Medical History, Past Surgical History, Family History and Social History were reviewed in [employer] record.   Review of Systems:   Constitutional: Negative for fever,  sweats, chills or weight loss.  Respiratory: Negative for shortness of breath.   Cardiovascular: Negative for chest pain, palpitations and leg swelling.  Gastrointestinal: See HPI.  Musculoskeletal: Negative for back pain or muscle aches.  Neurological: Negative for dizziness, headaches or paresthesias.    Physical Exam: There were no vitals taken for this visit. General: in no acute distress. Head: Normocephalic and atraumatic. Eyes: No scleral icterus. Conjunctiva pink . Ears: Normal auditory acuity. Mouth: Dentition intact. No ulcers or lesions.  Lungs: Clear throughout to auscultation. Heart: Regular rate and rhythm, no murmur. Abdomen: Soft, nontender and nondistended. No masses or hepatomegaly. Normal bowel sounds x 4 quadrants.  Rectal: *** Musculoskeletal: Symmetrical with no gross deformities. Extremities: No edema. Neurological: Alert oriented x 4. No focal deficits.  Psychological: Alert and cooperative. Normal mood and affect  Assessment and Recommendations: ***

## 2022-10-13 ENCOUNTER — Ambulatory Visit: Payer: Medicare Other | Admitting: Nurse Practitioner

## 2022-10-13 ENCOUNTER — Telehealth: Payer: Self-pay | Admitting: Internal Medicine

## 2022-10-13 NOTE — Telephone Encounter (Signed)
Patient states that she occasionally has IBS flare ups and has been taking 1/3 imodium a day for the past month. She is wanting to know if that is okay.  Appointment scheduled for 12/26/22 with Dr.Perry

## 2022-10-13 NOTE — Telephone Encounter (Signed)
Pt states she is taking 1 imodium tab and cutting it into thirds and taking the third every AM. Pt wants to make sure that is ok. Discussed with pt that it is fine for her to do this. She will keep her appt as scheduled.

## 2022-10-17 DIAGNOSIS — E559 Vitamin D deficiency, unspecified: Secondary | ICD-10-CM | POA: Diagnosis not present

## 2022-10-17 DIAGNOSIS — F5101 Primary insomnia: Secondary | ICD-10-CM | POA: Diagnosis not present

## 2022-10-17 DIAGNOSIS — I629 Nontraumatic intracranial hemorrhage, unspecified: Secondary | ICD-10-CM | POA: Diagnosis not present

## 2022-10-17 DIAGNOSIS — Z79899 Other long term (current) drug therapy: Secondary | ICD-10-CM | POA: Diagnosis not present

## 2022-10-17 DIAGNOSIS — Z23 Encounter for immunization: Secondary | ICD-10-CM | POA: Diagnosis not present

## 2022-10-17 DIAGNOSIS — Z1211 Encounter for screening for malignant neoplasm of colon: Secondary | ICD-10-CM | POA: Diagnosis not present

## 2022-10-17 DIAGNOSIS — Z1331 Encounter for screening for depression: Secondary | ICD-10-CM | POA: Diagnosis not present

## 2022-10-17 DIAGNOSIS — L308 Other specified dermatitis: Secondary | ICD-10-CM | POA: Diagnosis not present

## 2022-10-17 DIAGNOSIS — Z0000 Encounter for general adult medical examination without abnormal findings: Secondary | ICD-10-CM | POA: Diagnosis not present

## 2022-10-17 DIAGNOSIS — E782 Mixed hyperlipidemia: Secondary | ICD-10-CM | POA: Diagnosis not present

## 2022-10-17 DIAGNOSIS — R198 Other specified symptoms and signs involving the digestive system and abdomen: Secondary | ICD-10-CM | POA: Diagnosis not present

## 2022-10-17 DIAGNOSIS — K5790 Diverticulosis of intestine, part unspecified, without perforation or abscess without bleeding: Secondary | ICD-10-CM | POA: Diagnosis not present

## 2022-10-29 DIAGNOSIS — H04123 Dry eye syndrome of bilateral lacrimal glands: Secondary | ICD-10-CM | POA: Diagnosis not present

## 2022-10-29 DIAGNOSIS — H43811 Vitreous degeneration, right eye: Secondary | ICD-10-CM | POA: Diagnosis not present

## 2022-10-29 DIAGNOSIS — H2513 Age-related nuclear cataract, bilateral: Secondary | ICD-10-CM | POA: Diagnosis not present

## 2022-10-29 DIAGNOSIS — H353131 Nonexudative age-related macular degeneration, bilateral, early dry stage: Secondary | ICD-10-CM | POA: Diagnosis not present

## 2022-12-06 ENCOUNTER — Other Ambulatory Visit: Payer: Self-pay | Admitting: Obstetrics & Gynecology

## 2022-12-09 DIAGNOSIS — H00022 Hordeolum internum right lower eyelid: Secondary | ICD-10-CM | POA: Diagnosis not present

## 2022-12-26 ENCOUNTER — Encounter: Payer: Self-pay | Admitting: Internal Medicine

## 2022-12-26 ENCOUNTER — Ambulatory Visit (INDEPENDENT_AMBULATORY_CARE_PROVIDER_SITE_OTHER): Payer: Medicare Other | Admitting: Internal Medicine

## 2022-12-26 VITALS — BP 134/66 | HR 70 | Ht 63.0 in | Wt 149.0 lb

## 2022-12-26 DIAGNOSIS — K580 Irritable bowel syndrome with diarrhea: Secondary | ICD-10-CM

## 2022-12-26 DIAGNOSIS — K5730 Diverticulosis of large intestine without perforation or abscess without bleeding: Secondary | ICD-10-CM | POA: Diagnosis not present

## 2022-12-26 DIAGNOSIS — K589 Irritable bowel syndrome without diarrhea: Secondary | ICD-10-CM

## 2022-12-26 NOTE — Progress Notes (Signed)
HISTORY OF PRESENT ILLNESS:  Sharon Fox is a 72 y.o. female with a history of irritable bowel syndrome who presents today for follow-up regarding same.  I last saw the patient December 11, 2020 for irritable bowel syndrome and the need for repeat screening colonoscopy.  See dictation.  She subsequently underwent colonoscopy January 04, 2021.  She was found to have pandiverticulosis and rectosigmoid stenosis.  No polyps.  No future follow-up recommended due to favorable findings and age.  She was subsequently seen in this office August 23, 2021 by the GI PA regarding irritable bowel syndrome.  That dictation.  At that time she was using low-dose Imodium with success.  Later that year she developed a colovaginal fistula for which she underwent surgery at [facility] 06, 2022.  Reviewed.  Has recovered nicely.  He has gained a few pounds since.  Before getting started, patient tells me that she was unsatisfied that she called our office 1 day after being diagnosed with a colovaginal fistula, by her GYN, question GI attention.  Felt she did not receive proper attention.  I reviewed with her phone notes from March 04, 2022 on March 05, 2022 which clearly document that she was attended to probably, her case thoroughly reviewed outside of the formal setting, and the appropriate recommendation for surgical evaluation made.  Today she tells me that she can continue to have intermittent problems with loose stools this can affect her lifestyle, particularly when she is out and about with her grandchildren.  She tells me that generally one third of Imodium will keep her bowels and check.  If she takes more Imodium, she may develop constipation.  No issues to speak of at this time.  Review of MRI from October 2023 showing colovaginal fistula reviewed. Blood work from May 07, 2022 shows hemoglobin 11.2.  REVIEW OF SYSTEMS:  All non-GI ROS negative as otherwise stated except for hearing problems, urinary  leakage  Past Medical History:  Diagnosis Date   Allergy    cipro and seasonal    Arthritis    Diverticulosis    DJD (degenerative joint disease)    Hyperlipidemia    IBS (irritable bowel syndrome)    Osteoarthritis    Shingles    Vertigo    Vitamin D deficiency     Past Surgical History:  Procedure Laterality Date   BILATERAL OOPHORECTOMY Bilateral 05/06/2022   CESAREAN SECTION     COLONOSCOPY     2002, 2012 both negtive (diverticulosis)   COSMETIC SURGERY     at age 26   FISTULOTOMY Right 05/06/2022   Removed part of the colon that had entered vaginally   FOOT SURGERY     left foot -    RHINOPLASTY     right ear surgery Right 2021   ear implant right ear   VEIN SURGERY     [place] vein    Social History Sharon Fox  reports that she has never smoked. She has never used smokeless tobacco. She reports current alcohol use. She reports that she does not use drugs.  family history includes Breast cancer in her maternal grandmother, paternal grandmother, sister, and sister; Heart attack in her paternal grandfather; Stroke in her father.  Allergies  Allergen Reactions   Ciprofloxacin Rash       PHYSICAL EXAMINATION: Vital signs: BP 134/66   Pulse 70   Ht 5\' 3"  (1.6 m)   Wt 149 lb (67.6 kg)   BMI 26.39 kg/m  Constitutional: generally well-appearing, no acute distress Psychiatric: alert and oriented x3, cooperative Eyes: extraocular movements intact, anicteric, conjunctiva pink Mouth: oral pharynx moist, no lesions Neck: supple no lymphadenopathy Cardiovascular: heart regular rate and rhythm, no murmur Lungs: clear to auscultation bilaterally Abdomen: soft, nontender, nondistended, no obvious ascites, no peritoneal signs, normal bowel sounds, no organomegaly Rectal: Omitted Extremities: no clubbing, cyanosis, or lower extremity edema bilaterally Skin: no lesions on visible extremities Neuro: No focal deficits.  Cranial nerves  intact  ASSESSMENT:  1.  Diarrhea predominant IBS.  Managed nicely with low-dose Imodium. 2.  History of colovaginal fistula status post resection 3.  Pandiverticulosis   PLAN:  1.  Recommend Citrucel 1 to 2 tablespoons daily 2.  Okay to use low-dose Imodium.  Reviewed 3.  Resume general medical care with PCP and other specialist 4.  GI follow-up as needed Total time of 30 minutes was spent preparing to see the patient, reviewing outside operative reports, laboratories, and x-rays.  Obtaining interval history, performing medically appropriate physical exam, counseling educating patient regarding the above listed issues, directing medical therapies, and documenting clinical information in the health record

## 2022-12-26 NOTE — Patient Instructions (Signed)
Take 1-2 tablespoons of Citrucel daily in water or juice.  Take Imodium as needed  _______________________________________________________  If your blood pressure at your visit was 140/90 or greater, please contact your primary care physician to follow up on this.  _______________________________________________________  If you are age 72 or older, your body mass index should be between 23-30. Your Body mass index is 26.39 kg/m. If this is out of the aforementioned range listed, please consider follow up with your Primary Care Provider.  If you are age 70 or younger, your body mass index should be between 19-25. Your Body mass index is 26.39 kg/m. If this is out of the aformentioned range listed, please consider follow up with your Primary Care Provider.   ________________________________________________________  The [facility] providers would like to encourage you to use [facility] to communicate with providers for non-urgent requests or questions.  Due to long hold times on the telephone, sending your provider a message by [facility] may be a faster and more efficient way to get a response.  Please allow 48 business hours for a response.  Please remember that this is for non-urgent requests.  _______________________________________________________

## 2023-01-05 DIAGNOSIS — Z1231 Encounter for screening mammogram for malignant neoplasm of breast: Secondary | ICD-10-CM | POA: Diagnosis not present

## 2023-01-05 DIAGNOSIS — E559 Vitamin D deficiency, unspecified: Secondary | ICD-10-CM | POA: Diagnosis not present

## 2023-03-09 DIAGNOSIS — Z23 Encounter for immunization: Secondary | ICD-10-CM | POA: Diagnosis not present

## 2023-03-20 DIAGNOSIS — Z01118 Encounter for examination of ears and hearing with other abnormal findings: Secondary | ICD-10-CM | POA: Diagnosis not present

## 2023-03-20 DIAGNOSIS — Z9889 Other specified postprocedural states: Secondary | ICD-10-CM | POA: Diagnosis not present

## 2023-03-20 DIAGNOSIS — H90A31 Mixed conductive and sensorineural hearing loss, unilateral, right ear with restricted hearing on the contralateral side: Secondary | ICD-10-CM | POA: Diagnosis not present

## 2023-03-20 DIAGNOSIS — H9311 Tinnitus, right ear: Secondary | ICD-10-CM | POA: Diagnosis not present

## 2023-03-20 DIAGNOSIS — S0219XD Other fracture of base of skull, subsequent encounter for fracture with routine healing: Secondary | ICD-10-CM | POA: Diagnosis not present

## 2023-06-09 DIAGNOSIS — M47812 Spondylosis without myelopathy or radiculopathy, cervical region: Secondary | ICD-10-CM | POA: Diagnosis not present

## 2023-06-09 DIAGNOSIS — M25561 Pain in right knee: Secondary | ICD-10-CM | POA: Diagnosis not present

## 2023-06-16 DIAGNOSIS — S46812D Strain of other muscles, fascia and tendons at shoulder and upper arm level, left arm, subsequent encounter: Secondary | ICD-10-CM | POA: Diagnosis not present

## 2023-06-22 DIAGNOSIS — S46812D Strain of other muscles, fascia and tendons at shoulder and upper arm level, left arm, subsequent encounter: Secondary | ICD-10-CM | POA: Diagnosis not present

## 2023-06-23 DIAGNOSIS — M47812 Spondylosis without myelopathy or radiculopathy, cervical region: Secondary | ICD-10-CM | POA: Diagnosis not present

## 2023-06-29 DIAGNOSIS — S46812D Strain of other muscles, fascia and tendons at shoulder and upper arm level, left arm, subsequent encounter: Secondary | ICD-10-CM | POA: Diagnosis not present

## 2023-07-03 DIAGNOSIS — M542 Cervicalgia: Secondary | ICD-10-CM | POA: Diagnosis not present

## 2023-08-17 DIAGNOSIS — M47812 Spondylosis without myelopathy or radiculopathy, cervical region: Secondary | ICD-10-CM | POA: Diagnosis not present

## 2023-08-17 DIAGNOSIS — M542 Cervicalgia: Secondary | ICD-10-CM | POA: Diagnosis not present

## 2023-08-19 DIAGNOSIS — S83241A Other tear of medial meniscus, current injury, right knee, initial encounter: Secondary | ICD-10-CM | POA: Diagnosis not present

## 2023-08-25 DIAGNOSIS — M25561 Pain in right knee: Secondary | ICD-10-CM | POA: Diagnosis not present

## 2023-09-09 DIAGNOSIS — M25561 Pain in right knee: Secondary | ICD-10-CM | POA: Diagnosis not present

## 2023-09-17 DIAGNOSIS — M1711 Unilateral primary osteoarthritis, right knee: Secondary | ICD-10-CM | POA: Diagnosis not present

## 2023-09-17 DIAGNOSIS — M7062 Trochanteric bursitis, left hip: Secondary | ICD-10-CM | POA: Diagnosis not present

## 2023-09-17 DIAGNOSIS — S83241A Other tear of medial meniscus, current injury, right knee, initial encounter: Secondary | ICD-10-CM | POA: Diagnosis not present

## 2023-09-17 DIAGNOSIS — M25562 Pain in left knee: Secondary | ICD-10-CM | POA: Diagnosis not present

## 2023-09-18 DIAGNOSIS — D2271 Melanocytic nevi of right lower limb, including hip: Secondary | ICD-10-CM | POA: Diagnosis not present

## 2023-09-18 DIAGNOSIS — D2261 Melanocytic nevi of right upper limb, including shoulder: Secondary | ICD-10-CM | POA: Diagnosis not present

## 2023-09-18 DIAGNOSIS — L821 Other seborrheic keratosis: Secondary | ICD-10-CM | POA: Diagnosis not present

## 2023-09-18 DIAGNOSIS — D239 Other benign neoplasm of skin, unspecified: Secondary | ICD-10-CM | POA: Diagnosis not present

## 2023-09-18 DIAGNOSIS — L578 Other skin changes due to chronic exposure to nonionizing radiation: Secondary | ICD-10-CM | POA: Diagnosis not present

## 2023-09-18 DIAGNOSIS — D225 Melanocytic nevi of trunk: Secondary | ICD-10-CM | POA: Diagnosis not present

## 2023-09-21 ENCOUNTER — Other Ambulatory Visit: Payer: Self-pay | Admitting: Obstetrics & Gynecology

## 2023-09-21 NOTE — Telephone Encounter (Signed)
 Pt left voice message requesting medication refill to [facility] [address] [place]:  Med refill request: estadiol 0.1 mg vaginal cream Last AEX: 06/30/22 ML Next AEX: 10/20/23 EB Last MMG (if hormonal med) 01/10/22 Refill authorized: Last Rx sent #42.5 g with 4 refills on 09/11/22. Please approve or deny as appropriate.

## 2023-09-21 NOTE — Telephone Encounter (Signed)
 Med refill request: estrace   Last AEX: 07/18/21 Next AEX: 10/20/23 Last MMG (if hormonal med) 01/10/22 biads cat 1 neg  Refill authorized: last rx 09/11/22 #42.5g with 4 refills. Please approve or deny

## 2023-09-23 DIAGNOSIS — M25561 Pain in right knee: Secondary | ICD-10-CM | POA: Diagnosis not present

## 2023-09-23 DIAGNOSIS — M25562 Pain in left knee: Secondary | ICD-10-CM | POA: Diagnosis not present

## 2023-09-23 DIAGNOSIS — R269 Unspecified abnormalities of gait and mobility: Secondary | ICD-10-CM | POA: Diagnosis not present

## 2023-10-14 DIAGNOSIS — R269 Unspecified abnormalities of gait and mobility: Secondary | ICD-10-CM | POA: Diagnosis not present

## 2023-10-14 DIAGNOSIS — M25561 Pain in right knee: Secondary | ICD-10-CM | POA: Diagnosis not present

## 2023-10-14 DIAGNOSIS — M25562 Pain in left knee: Secondary | ICD-10-CM | POA: Diagnosis not present

## 2023-10-20 ENCOUNTER — Encounter: Payer: Self-pay | Admitting: Obstetrics and Gynecology

## 2023-10-20 ENCOUNTER — Ambulatory Visit (INDEPENDENT_AMBULATORY_CARE_PROVIDER_SITE_OTHER): Admitting: Obstetrics and Gynecology

## 2023-10-20 VITALS — BP 144/76 | HR 94 | Ht 60.75 in | Wt 145.0 lb

## 2023-10-20 DIAGNOSIS — Z1231 Encounter for screening mammogram for malignant neoplasm of breast: Secondary | ICD-10-CM

## 2023-10-20 DIAGNOSIS — N952 Postmenopausal atrophic vaginitis: Secondary | ICD-10-CM

## 2023-10-20 DIAGNOSIS — M8000XA Age-related osteoporosis with current pathological fracture, unspecified site, initial encounter for fracture: Secondary | ICD-10-CM

## 2023-10-20 DIAGNOSIS — E2839 Other primary ovarian failure: Secondary | ICD-10-CM

## 2023-10-20 DIAGNOSIS — Z1331 Encounter for screening for depression: Secondary | ICD-10-CM

## 2023-10-20 DIAGNOSIS — M810 Age-related osteoporosis without current pathological fracture: Secondary | ICD-10-CM

## 2023-10-20 DIAGNOSIS — Z01419 Encounter for gynecological examination (general) (routine) without abnormal findings: Secondary | ICD-10-CM | POA: Diagnosis not present

## 2023-10-20 MED ORDER — ROMOSOZUMAB-AQQG 105 MG/1.17ML ~~LOC~~ SOSY
210.0000 mg | PREFILLED_SYRINGE | Freq: Once | SUBCUTANEOUS | Status: AC
Start: 2023-11-03 — End: 2024-04-12
  Administered 2024-04-12: 210 mg via SUBCUTANEOUS

## 2023-10-20 NOTE — Progress Notes (Signed)
 73 y.o. y.o. female here for medicare gyn exam. No LMP recorded. Patient is postmenopausal.    h/o Robotic sigmoid colectomy with colovaginal fistula repair and bilateral salpingo-oophorectomy on 05/06/22.  Uterus with fibroids intact.  Denies any PMB  Show images for US  Transvaginal Non-OB Study Result  Narrative & Impression  T/V images.  Anteverted irregular uterus with several calcified subserous fibroids.  The overall uterine size is measured at 6.12 x 3.01 x 2.71 cm.  4 fibroids are measured from 2.11 cm the endometrial lining is thin and symmetrical at 2.1 mm with no mass seen.  The right ovary is small with atrophic appearance.  The left adnexa shows an avascular cystic mass with debris's measured at 4.1 x 2.4 x 2.4 cm.  This structure is documented both transvaginally and transabdominally.  No free fluid in the pelvis.  08/28/20 dxa with Left hip -2.6 osteoporosis, did not start any treatment and wanted to wait until dental implant ws completed.  She is ready now and would like evenity followed by prolia. Repeat dxa scheduled but can start medication.  Counseled on medication and counseled on risk of fracture and mortality rates with no treatment with known osteoporosis.  She agreed Pap smear 2023. Denies abnormal. Repeat in 2025 Colonoscopy 2022 Denies any GI/GU complaints MMG 2022 repeat ordered  Body mass index is 27.62 kg/m.     10/20/2023    1:36 PM  Depression screen PHQ 2/9  Decreased Interest 0  Down, Depressed, Hopeless 0  PHQ - 2 Score 0    Blood pressure (!) 154/80, pulse 94, height 5' 0.75" (1.543 m), weight 145 lb (65.8 kg), SpO2 97%.  States she is stressed with move and is seeing her PMD next week     Component Value Date/Time   DIAGPAP  02/13/2022 1132    - Negative for intraepithelial lesion or malignancy (NILM)   ADEQPAP  02/13/2022 1132    Satisfactory for evaluation; transformation zone component PRESENT.    GYN HISTORY:    Component Value  Date/Time   DIAGPAP  02/13/2022 1132    - Negative for intraepithelial lesion or malignancy (NILM)   ADEQPAP  02/13/2022 1132    Satisfactory for evaluation; transformation zone component PRESENT.    OB History  Gravida Para Term Preterm AB Living  1 1 1  0 0 1  SAB IAB Ectopic Multiple Live Births  0 0 0  1    # Outcome Date GA Lbr Len/2nd Weight Sex Type Anes PTL Lv  1 Term 15    F CS-Unspec   LIV    Past Medical History:  Diagnosis Date   Allergy    cipro and seasonal    Arthritis    Diverticulosis    DJD (degenerative joint disease)    Hyperlipidemia    IBS (irritable bowel syndrome)    Osteoarthritis    Shingles    Vertigo    Vitamin D  deficiency     Past Surgical History:  Procedure Laterality Date   BILATERAL OOPHORECTOMY Bilateral 05/06/2022   CESAREAN SECTION     COLONOSCOPY     2002, 2012 both negtive (diverticulosis)   COSMETIC SURGERY     at age 51   FISTULOTOMY Right 05/06/2022   Removed part of the colon that had entered vaginally   FOOT SURGERY     left foot -    RHINOPLASTY     right ear surgery Right 2021   ear implant right ear  VEIN SURGERY     [place] vein    Current Outpatient Medications on File Prior to Visit  Medication Sig Dispense Refill   Cholecalciferol  (VITAMIN D3) 25 MCG (1000 UT) CHEW Chew by mouth.     estradiol  (ESTRACE ) 0.1 MG/GM vaginal cream PLACE 1 APPLICATORFUL VAGINALLY 2 (TWO) TIMES A WEEK. 42.5 g 4   Glucosamine HCl (GLUCOSAMINE PO) Take by mouth.     ibuprofen (ADVIL) 200 MG tablet Take 400 mg by mouth.     meloxicam (MOBIC) 15 MG tablet Take 7.5 mg by mouth daily.     Multiple Vitamins-Minerals (CENTRUM SILVER PO) Take 1 capsule by mouth daily.     SODIUM FLUORIDE 5000 PPM 1.1 % GEL dental gel as directed.     zolpidem  (AMBIEN ) 5 MG tablet 2.5 mg as needed.     fluticasone (FLONASE) 50 MCG/ACT nasal spray Place into both nostrils daily. (Patient not taking: Reported on 10/20/2023)     No current  facility-administered medications on file prior to visit.    Social History   Socioeconomic History   Marital status: Married    Spouse name: Not on file   Number of children: Not on file   Years of education: Not on file   Highest education level: Not on file  Occupational History    Employer: [employer]  Tobacco Use   Smoking status: Never   Smokeless tobacco: Never  Vaping Use   Vaping status: Never Used  Substance and Sexual Activity   Alcohol use: Yes    Comment: OCC   Drug use: No   Sexual activity: Yes    Partners: Male    Birth control/protection: Post-menopausal    Comment: 1st intercourse- 21, more than 5  Other Topics Concern   Not on file  Social History Narrative   ** Merged History Encounter **       Married, kids and grandchildren 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 : No  Physical Activity: Not on file  Stress: Not on file  Social Connections: Not on file  Intimate Partner Violence: Not on file    Family History  Problem Relation Age of Onset   Stroke Father    Breast cancer Sister    Breast cancer Sister    Breast cancer Maternal Grandmother    Breast cancer Paternal Grandmother    Heart attack Paternal Grandfather      Allergies  Allergen Reactions   Ciprofloxacin Rash      Patient's last menstrual period was No LMP recorded. Patient is postmenopausal..              Review of Systems Alls systems reviewed and are negative.     Physical Exam Constitutional:      Appearance: Normal appearance.  Genitourinary:     Vulva and urethral meatus normal.  No lesions in the vagina.     Right Labia: No rash, lesions or skin changes.    Left Labia: No lesions, skin changes or rash.    No vaginal discharge or tenderness.     Anterior vaginal prolapse present.    Moderate vaginal atrophy present.     Right Adnexa: absent.    Left Adnexa: absent.    No cervical motion tenderness or discharge.     Uterus is irregular.     Uterus is not enlarged or tender.  Breasts:    Right: Normal.     Left: Normal.  HENT:     Head: Normocephalic.  Neck:     Thyroid : No thyroid  mass, thyromegaly or thyroid  tenderness.  Cardiovascular:     Rate and Rhythm: Normal rate and regular rhythm.     Heart sounds: Normal heart sounds, S1 normal and S2 normal.  Pulmonary:     Effort: Pulmonary effort is normal.     Breath sounds: Normal breath sounds and air entry.  Abdominal:     General: There is no distension.     Palpations: Abdomen is soft. There is no mass.     Tenderness: There is no abdominal tenderness. There is no guarding or rebound.  Musculoskeletal:        General: Normal range of motion.     Cervical back: Full passive range of motion without pain, normal range of motion and neck supple. No tenderness.     Right lower leg: No edema.     Left lower leg: No edema.  Neurological:     Mental Status: She is alert.  Skin:    General: Skin is warm.  Psychiatric:        Mood and Affect: Mood normal.        Behavior: Behavior normal.        Thought Content: Thought content normal.  Vitals and nursing note reviewed. Exam conducted with a chaperone present.    Carolynne Citron, CMA was present for the entire exam   A:         Medicare GYN exam, osteoporosis (no treatment and is ready to start)                             P:        Pap smear not  indicated Encouraged annual mammogram screening.  Order placed Colon cancer screening up-to-date DXA ordered today Counseled on evenity and r/b/a/I of the medication and need for calcium and Vit D on it. Labs collected today to start evenity. PA to be placed Labs and immunizations to do with PMD Discussed breast self exams Encouraged healthy lifestyle practices Encouraged Vit D and Calcium   No follow-ups on file.  Reinaldo Caras

## 2023-10-21 ENCOUNTER — Ambulatory Visit: Payer: Self-pay | Admitting: Obstetrics and Gynecology

## 2023-10-21 DIAGNOSIS — M1711 Unilateral primary osteoarthritis, right knee: Secondary | ICD-10-CM | POA: Diagnosis not present

## 2023-10-21 LAB — COMPREHENSIVE METABOLIC PANEL WITH GFR
AG Ratio: 1.5 (calc) (ref 1.0–2.5)
ALT: 21 U/L (ref 6–29)
AST: 21 U/L (ref 10–35)
Albumin: 4.2 g/dL (ref 3.6–5.1)
Alkaline phosphatase (APISO): 61 U/L (ref 37–153)
BUN/Creatinine Ratio: 47 (calc) — ABNORMAL HIGH (ref 6–22)
BUN: 20 mg/dL (ref 7–25)
CO2: 28 mmol/L (ref 20–32)
Calcium: 9.3 mg/dL (ref 8.6–10.4)
Chloride: 101 mmol/L (ref 98–110)
Creat: 0.43 mg/dL — ABNORMAL LOW (ref 0.60–1.00)
Globulin: 2.8 g/dL (ref 1.9–3.7)
Glucose, Bld: 126 mg/dL — ABNORMAL HIGH (ref 65–99)
Potassium: 3.5 mmol/L (ref 3.5–5.3)
Sodium: 138 mmol/L (ref 135–146)
Total Bilirubin: 0.5 mg/dL (ref 0.2–1.2)
Total Protein: 7 g/dL (ref 6.1–8.1)
eGFR: 103 mL/min/{1.73_m2} (ref >=60)

## 2023-10-21 LAB — CBC
HCT: 40.6 % (ref 35.0–45.0)
Hemoglobin: 13.7 g/dL (ref 11.7–15.5)
MCH: 30.2 pg (ref 27.0–33.0)
MCHC: 33.7 g/dL (ref 32.0–36.0)
MCV: 89.6 fL (ref 80.0–100.0)
MPV: 10.4 fL (ref 7.5–12.5)
Platelets: 342 10*3/uL (ref 140–400)
RBC: 4.53 10*6/uL (ref 3.80–5.10)
RDW: 12.1 % (ref 11.0–15.0)
WBC: 5.7 10*3/uL (ref 3.8–10.8)

## 2023-10-21 LAB — PARATHYROID HORMONE, INTACT (NO CA): PTH: 26 pg/mL (ref 16–77)

## 2023-10-21 LAB — VITAMIN D 25 HYDROXY (VIT D DEFICIENCY, FRACTURES): Vit D, 25-Hydroxy: 65 ng/mL (ref 30–100)

## 2023-10-22 ENCOUNTER — Encounter: Payer: Self-pay | Admitting: Obstetrics and Gynecology

## 2023-10-22 ENCOUNTER — Other Ambulatory Visit: Payer: Self-pay | Admitting: *Deleted

## 2023-10-22 DIAGNOSIS — Z79899 Other long term (current) drug therapy: Secondary | ICD-10-CM | POA: Diagnosis not present

## 2023-10-22 DIAGNOSIS — L308 Other specified dermatitis: Secondary | ICD-10-CM | POA: Diagnosis not present

## 2023-10-22 DIAGNOSIS — E782 Mixed hyperlipidemia: Secondary | ICD-10-CM | POA: Diagnosis not present

## 2023-10-22 DIAGNOSIS — Z0000 Encounter for general adult medical examination without abnormal findings: Secondary | ICD-10-CM | POA: Diagnosis not present

## 2023-10-22 DIAGNOSIS — F418 Other specified anxiety disorders: Secondary | ICD-10-CM | POA: Diagnosis not present

## 2023-10-22 DIAGNOSIS — M8000XA Age-related osteoporosis with current pathological fracture, unspecified site, initial encounter for fracture: Secondary | ICD-10-CM

## 2023-10-22 DIAGNOSIS — Z1331 Encounter for screening for depression: Secondary | ICD-10-CM | POA: Diagnosis not present

## 2023-10-22 DIAGNOSIS — R198 Other specified symptoms and signs involving the digestive system and abdomen: Secondary | ICD-10-CM | POA: Diagnosis not present

## 2023-10-22 DIAGNOSIS — I629 Nontraumatic intracranial hemorrhage, unspecified: Secondary | ICD-10-CM | POA: Diagnosis not present

## 2023-10-22 DIAGNOSIS — K5790 Diverticulosis of intestine, part unspecified, without perforation or abscess without bleeding: Secondary | ICD-10-CM | POA: Diagnosis not present

## 2023-10-22 DIAGNOSIS — F5101 Primary insomnia: Secondary | ICD-10-CM | POA: Diagnosis not present

## 2023-10-22 DIAGNOSIS — E559 Vitamin D deficiency, unspecified: Secondary | ICD-10-CM | POA: Diagnosis not present

## 2023-10-22 DIAGNOSIS — R030 Elevated blood-pressure reading, without diagnosis of hypertension: Secondary | ICD-10-CM | POA: Diagnosis not present

## 2023-10-22 DIAGNOSIS — R739 Hyperglycemia, unspecified: Secondary | ICD-10-CM | POA: Diagnosis not present

## 2023-10-22 DIAGNOSIS — M1711 Unilateral primary osteoarthritis, right knee: Secondary | ICD-10-CM | POA: Diagnosis not present

## 2023-10-22 MED ORDER — ROMOSOZUMAB-AQQG 105 MG/1.17ML ~~LOC~~ SOSY
210.0000 mg | PREFILLED_SYRINGE | SUBCUTANEOUS | Status: AC
  Administered 2023-10-29 – 2024-03-08 (×5): 210 mg via SUBCUTANEOUS

## 2023-10-27 ENCOUNTER — Other Ambulatory Visit (HOSPITAL_BASED_OUTPATIENT_CLINIC_OR_DEPARTMENT_OTHER)

## 2023-10-28 DIAGNOSIS — M25562 Pain in left knee: Secondary | ICD-10-CM | POA: Diagnosis not present

## 2023-10-28 DIAGNOSIS — M25561 Pain in right knee: Secondary | ICD-10-CM | POA: Diagnosis not present

## 2023-10-28 DIAGNOSIS — R269 Unspecified abnormalities of gait and mobility: Secondary | ICD-10-CM | POA: Diagnosis not present

## 2023-10-29 ENCOUNTER — Ambulatory Visit (HOSPITAL_BASED_OUTPATIENT_CLINIC_OR_DEPARTMENT_OTHER)
Admission: RE | Admit: 2023-10-29 | Discharge: 2023-10-29 | Disposition: A | Discharge status: Home / Self Care | Source: Ambulatory Visit | Attending: Obstetrics and Gynecology | Admitting: Obstetrics and Gynecology

## 2023-10-29 ENCOUNTER — Other Ambulatory Visit (HOSPITAL_BASED_OUTPATIENT_CLINIC_OR_DEPARTMENT_OTHER): Payer: Self-pay | Admitting: Internal Medicine

## 2023-10-29 ENCOUNTER — Ambulatory Visit (INDEPENDENT_AMBULATORY_CARE_PROVIDER_SITE_OTHER)

## 2023-10-29 DIAGNOSIS — Z780 Asymptomatic menopausal state: Secondary | ICD-10-CM | POA: Diagnosis not present

## 2023-10-29 DIAGNOSIS — M810 Age-related osteoporosis without current pathological fracture: Secondary | ICD-10-CM

## 2023-10-29 DIAGNOSIS — E782 Mixed hyperlipidemia: Secondary | ICD-10-CM

## 2023-10-29 DIAGNOSIS — M8000XA Age-related osteoporosis with current pathological fracture, unspecified site, initial encounter for fracture: Secondary | ICD-10-CM | POA: Diagnosis not present

## 2023-10-29 DIAGNOSIS — E2839 Other primary ovarian failure: Secondary | ICD-10-CM | POA: Insufficient documentation

## 2023-10-29 DIAGNOSIS — Z01419 Encounter for gynecological examination (general) (routine) without abnormal findings: Secondary | ICD-10-CM | POA: Diagnosis not present

## 2023-10-29 NOTE — Progress Notes (Signed)
 Patient in today for first Prolia injection. Patient's initial calcium level was obtained on 10/20/23.  Result: 9.3 GRF: 10/19/21 Results:103  Last AEX: 10/20/23 Last BMD: 10/29/23  Injection given in right and left arm.  Patient tolerated injection well.  Routed to provider for review.

## 2023-11-02 DIAGNOSIS — M25562 Pain in left knee: Secondary | ICD-10-CM | POA: Diagnosis not present

## 2023-11-02 DIAGNOSIS — M25561 Pain in right knee: Secondary | ICD-10-CM | POA: Diagnosis not present

## 2023-11-02 DIAGNOSIS — R269 Unspecified abnormalities of gait and mobility: Secondary | ICD-10-CM | POA: Diagnosis not present

## 2023-11-04 DIAGNOSIS — M1711 Unilateral primary osteoarthritis, right knee: Secondary | ICD-10-CM | POA: Diagnosis not present

## 2023-11-10 DIAGNOSIS — D485 Neoplasm of uncertain behavior of skin: Secondary | ICD-10-CM | POA: Diagnosis not present

## 2023-11-10 DIAGNOSIS — L821 Other seborrheic keratosis: Secondary | ICD-10-CM | POA: Diagnosis not present

## 2023-11-10 DIAGNOSIS — L820 Inflamed seborrheic keratosis: Secondary | ICD-10-CM | POA: Diagnosis not present

## 2023-11-11 DIAGNOSIS — M1711 Unilateral primary osteoarthritis, right knee: Secondary | ICD-10-CM | POA: Diagnosis not present

## 2023-11-24 DIAGNOSIS — M1711 Unilateral primary osteoarthritis, right knee: Secondary | ICD-10-CM | POA: Diagnosis not present

## 2023-11-25 ENCOUNTER — Ambulatory Visit (HOSPITAL_BASED_OUTPATIENT_CLINIC_OR_DEPARTMENT_OTHER)
Admission: RE | Admit: 2023-11-25 | Discharge: 2023-11-25 | Disposition: A | Discharge status: Home / Self Care | Payer: Self-pay | Source: Ambulatory Visit | Attending: Internal Medicine | Admitting: Internal Medicine

## 2023-11-25 DIAGNOSIS — E782 Mixed hyperlipidemia: Secondary | ICD-10-CM | POA: Insufficient documentation

## 2023-11-30 ENCOUNTER — Ambulatory Visit (INDEPENDENT_AMBULATORY_CARE_PROVIDER_SITE_OTHER)

## 2023-11-30 ENCOUNTER — Other Ambulatory Visit (HOSPITAL_COMMUNITY): Payer: Self-pay | Admitting: Internal Medicine

## 2023-11-30 DIAGNOSIS — M8000XA Age-related osteoporosis with current pathological fracture, unspecified site, initial encounter for fracture: Secondary | ICD-10-CM | POA: Diagnosis not present

## 2023-11-30 DIAGNOSIS — R931 Abnormal findings on diagnostic imaging of heart and coronary circulation: Secondary | ICD-10-CM

## 2023-12-17 DIAGNOSIS — E7800 Pure hypercholesterolemia, unspecified: Secondary | ICD-10-CM | POA: Diagnosis not present

## 2023-12-17 DIAGNOSIS — H539 Unspecified visual disturbance: Secondary | ICD-10-CM | POA: Diagnosis not present

## 2023-12-30 ENCOUNTER — Ambulatory Visit (INDEPENDENT_AMBULATORY_CARE_PROVIDER_SITE_OTHER)

## 2023-12-30 DIAGNOSIS — M8000XA Age-related osteoporosis with current pathological fracture, unspecified site, initial encounter for fracture: Secondary | ICD-10-CM | POA: Diagnosis not present

## 2023-12-30 NOTE — Progress Notes (Signed)
 3rd Evenity  - Shot given in both arms, one in Right arm other in Left arm.

## 2024-02-04 ENCOUNTER — Ambulatory Visit (INDEPENDENT_AMBULATORY_CARE_PROVIDER_SITE_OTHER)

## 2024-02-04 DIAGNOSIS — M8000XA Age-related osteoporosis with current pathological fracture, unspecified site, initial encounter for fracture: Secondary | ICD-10-CM

## 2024-02-04 DIAGNOSIS — Z79899 Other long term (current) drug therapy: Secondary | ICD-10-CM | POA: Diagnosis not present

## 2024-02-04 NOTE — Progress Notes (Signed)
 4th evenity  given on 02/04/24 in both arms, pt tolerated it well.

## 2024-02-23 DIAGNOSIS — Z029 Encounter for administrative examinations, unspecified: Secondary | ICD-10-CM | POA: Diagnosis not present

## 2024-02-23 DIAGNOSIS — I10 Essential (primary) hypertension: Secondary | ICD-10-CM | POA: Diagnosis not present

## 2024-02-23 DIAGNOSIS — R52 Pain, unspecified: Secondary | ICD-10-CM | POA: Diagnosis not present

## 2024-02-23 DIAGNOSIS — E785 Hyperlipidemia, unspecified: Secondary | ICD-10-CM | POA: Diagnosis not present

## 2024-02-23 DIAGNOSIS — R030 Elevated blood-pressure reading, without diagnosis of hypertension: Secondary | ICD-10-CM | POA: Diagnosis not present

## 2024-02-23 DIAGNOSIS — I2510 Atherosclerotic heart disease of native coronary artery without angina pectoris: Secondary | ICD-10-CM | POA: Diagnosis not present

## 2024-03-07 DIAGNOSIS — Z1231 Encounter for screening mammogram for malignant neoplasm of breast: Secondary | ICD-10-CM | POA: Diagnosis not present

## 2024-03-08 ENCOUNTER — Ambulatory Visit (INDEPENDENT_AMBULATORY_CARE_PROVIDER_SITE_OTHER)

## 2024-03-08 DIAGNOSIS — M810 Age-related osteoporosis without current pathological fracture: Secondary | ICD-10-CM | POA: Insufficient documentation

## 2024-03-08 DIAGNOSIS — H019 Unspecified inflammation of eyelid: Secondary | ICD-10-CM | POA: Diagnosis not present

## 2024-03-08 DIAGNOSIS — M8000XA Age-related osteoporosis with current pathological fracture, unspecified site, initial encounter for fracture: Secondary | ICD-10-CM

## 2024-03-09 DIAGNOSIS — Z23 Encounter for immunization: Secondary | ICD-10-CM | POA: Diagnosis not present

## 2024-03-10 DIAGNOSIS — H04123 Dry eye syndrome of bilateral lacrimal glands: Secondary | ICD-10-CM | POA: Diagnosis not present

## 2024-03-10 DIAGNOSIS — H2513 Age-related nuclear cataract, bilateral: Secondary | ICD-10-CM | POA: Diagnosis not present

## 2024-03-10 DIAGNOSIS — H353131 Nonexudative age-related macular degeneration, bilateral, early dry stage: Secondary | ICD-10-CM | POA: Diagnosis not present

## 2024-03-10 DIAGNOSIS — H43811 Vitreous degeneration, right eye: Secondary | ICD-10-CM | POA: Diagnosis not present

## 2024-03-11 DIAGNOSIS — R928 Other abnormal and inconclusive findings on diagnostic imaging of breast: Secondary | ICD-10-CM | POA: Diagnosis not present

## 2024-04-11 ENCOUNTER — Ambulatory Visit

## 2024-04-12 ENCOUNTER — Ambulatory Visit

## 2024-04-12 ENCOUNTER — Ambulatory Visit: Admitting: *Deleted

## 2024-04-12 DIAGNOSIS — M810 Age-related osteoporosis without current pathological fracture: Secondary | ICD-10-CM | POA: Diagnosis not present

## 2024-04-18 ENCOUNTER — Telehealth: Payer: Self-pay

## 2024-04-18 ENCOUNTER — Other Ambulatory Visit: Payer: Self-pay | Admitting: *Deleted

## 2024-04-18 DIAGNOSIS — M810 Age-related osteoporosis without current pathological fracture: Secondary | ICD-10-CM

## 2024-04-18 MED ORDER — ROMOSOZUMAB-AQQG 105 MG/1.17ML ~~LOC~~ SOSY
210.0000 mg | PREFILLED_SYRINGE | SUBCUTANEOUS | Status: AC
  Administered 2024-05-13: 210 mg via SUBCUTANEOUS

## 2024-04-18 NOTE — Telephone Encounter (Signed)
 Sharon Fox

## 2024-04-18 NOTE — Telephone Encounter (Signed)
 Buy/Bill (Office supplied medication)  Out-of-pocket cost due at time of clinic visit: $0  Number of injection/visits approved: ---  Primary: MEDICARE Co-insurance: 0% Admin fee co-insurance: 0%  Secondary: BCBS[place]-MEDSUP Co-insurance:  Admin fee co-insurance:   Medical Benefit Details: Date Benefits were checked: 04/18/24 Deductible: $257 Met of $257 Required/ Coinsurance: 0%/ Admin Fee: 0%  Prior Auth: N/A PA# Expiration Date:   # of doses approved: -----------------------------------------------------------------------  Patient NOT eligible for Copay Card. Copay Card can make patient's cost as little as $25. Link to apply: [email or web address]://[email or web address]  ** This summary of benefits is an estimation of the patient's out-of-pocket cost. Exact cost may very based on individual plan coverage.

## 2024-04-18 NOTE — Telephone Encounter (Signed)
 See referral

## 2024-04-18 NOTE — Telephone Encounter (Signed)
 Evenity  VOB initiated via [email or web address]  Last OV:  Next OV:  Last Evenity  inj: 04/12/24 Next Evenity  inj DUE: 05/12/24

## 2024-05-06 ENCOUNTER — Telehealth: Payer: Self-pay

## 2024-05-06 NOTE — Telephone Encounter (Signed)
 Buy/Bill (Office supplied medication)  Out-of-pocket cost due at time of clinic visit: $0  Number of injection/visits approved: ---  Primary: MEDICARE Co-insurance: 0% Admin fee co-insurance: 0%  Secondary: BCBS[place]-MEDSUP Co-insurance:  Admin fee co-insurance:   Medical Benefit Details: Date Benefits were checked: 04/18/24 Deductible: $257 Met of $257 Required/ Coinsurance: 0%/ Admin Fee: 0%  Prior Auth: N/A PA# Expiration Date:   # of doses approved: -----------------------------------------------------------------------  Patient NOT eligible for Copay Card. Copay Card can make patient's cost as little as $25. Link to apply: [email or web address]://[email or web address]  ** This summary of benefits is an estimation of the patient's out-of-pocket cost. Exact cost may very based on individual plan coverage.

## 2024-05-11 DIAGNOSIS — M170 Bilateral primary osteoarthritis of knee: Secondary | ICD-10-CM | POA: Diagnosis not present

## 2024-05-13 ENCOUNTER — Ambulatory Visit (INDEPENDENT_AMBULATORY_CARE_PROVIDER_SITE_OTHER)

## 2024-05-13 DIAGNOSIS — M810 Age-related osteoporosis without current pathological fracture: Secondary | ICD-10-CM

## 2024-05-13 NOTE — Progress Notes (Signed)
 05/13/24: 7th Evenity  - one injection given (SQ) in each arm, per Pt's request. (IC, CCMA)

## 2024-06-08 ENCOUNTER — Other Ambulatory Visit: Payer: Self-pay | Admitting: *Deleted

## 2024-06-08 ENCOUNTER — Encounter: Payer: Self-pay | Admitting: *Deleted

## 2024-06-08 DIAGNOSIS — M810 Age-related osteoporosis without current pathological fracture: Secondary | ICD-10-CM

## 2024-06-08 MED ORDER — ROMOSOZUMAB-AQQG 105 MG/1.17ML ~~LOC~~ SOSY
210.0000 mg | PREFILLED_SYRINGE | SUBCUTANEOUS | Status: AC
  Administered 2024-07-05: 210 mg via SUBCUTANEOUS

## 2024-06-10 ENCOUNTER — Telehealth: Payer: Self-pay

## 2024-06-10 ENCOUNTER — Other Ambulatory Visit (HOSPITAL_COMMUNITY): Payer: Self-pay

## 2024-06-10 NOTE — Telephone Encounter (Signed)
 SABRA

## 2024-06-10 NOTE — Telephone Encounter (Signed)
 Evenity  VOB initiated via [email or web address]  Last OV:  Next OV:  Last Evenity  inj: 05/13/24 Next Evenity  inj DUE: 06/13/24

## 2024-06-10 NOTE — Telephone Encounter (Signed)
 Buy/Bill (Office supplied medication)  Out-of-pocket cost due at time of clinic visit: $283  Number of injection/visits approved: ---  Primary: MEDICARE Co-insurance: 0% Admin fee co-insurance: 0%  Secondary: [id number] Co-insurance:  covers the co-insurance and 100% of the excess charges. This plan does not cover the Medicare Part B Deductible Admin fee co-insurance:   Medical Benefit Details: Date Benefits were checked: 06/10/24 Deductible: $0 Met of $283 Required/ Coinsurance: 0%/ Admin Fee: 0%  Prior Auth: N/A PA# Expiration Date:   # of doses approved: -----------------------------------------------------------------------  Patient NOT eligible for Copay Card. Copay Card can make patient's cost as little as $25. Link to apply: [email or web address]  ** This summary of benefits is an estimation of the patient's out-of-pocket cost. Exact cost may very based on individual plan coverage.

## 2024-06-15 NOTE — Telephone Encounter (Signed)
 See referral

## 2024-06-21 ENCOUNTER — Ambulatory Visit

## 2024-06-29 ENCOUNTER — Ambulatory Visit

## 2024-07-05 ENCOUNTER — Ambulatory Visit

## 2024-07-05 DIAGNOSIS — M810 Age-related osteoporosis without current pathological fracture: Secondary | ICD-10-CM | POA: Diagnosis not present

## 2024-08-03 ENCOUNTER — Ambulatory Visit
